# Patient Record
Sex: Male | Born: 1995 | Race: Black or African American | Hispanic: No | Marital: Single | Smoking: Never smoker
Health system: Southern US, Community
[De-identification: ages and names within clinical notes are randomized; demographics above are authoritative.]

## PROBLEM LIST (undated history)

## (undated) DIAGNOSIS — K029 Dental caries, unspecified: Secondary | ICD-10-CM

## (undated) DIAGNOSIS — N049 Nephrotic syndrome with unspecified morphologic changes: Secondary | ICD-10-CM

## (undated) HISTORY — DX: Nephrotic syndrome with unspecified morphologic changes: N04.9

## (undated) HISTORY — DX: Dental caries, unspecified: K02.9

## (undated) HISTORY — PX: RENAL BIOPSY: SHX156

---
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
## 2016-04-14 ENCOUNTER — Encounter: Payer: Self-pay | Admitting: *Deleted

## 2016-04-14 ENCOUNTER — Ambulatory Visit (INDEPENDENT_AMBULATORY_CARE_PROVIDER_SITE_OTHER): Payer: Medicaid Other | Admitting: Pediatrics

## 2016-04-14 VITALS — Temp 97.8°F | Wt 134.8 lb

## 2016-04-14 DIAGNOSIS — R1084 Generalized abdominal pain: Secondary | ICD-10-CM

## 2016-04-14 DIAGNOSIS — R197 Diarrhea, unspecified: Secondary | ICD-10-CM | POA: Diagnosis not present

## 2016-04-14 NOTE — Progress Notes (Addendum)
History was provided by the patient.  [name] is a 20 y.o. male who is here for acute visit.     HPI:   Abdominal pain - diffuse but more concentrated in epigastric and LUQ region for the past 5 days.  Has been sharp and seems to be constant with bouts of 10 minutes of relief. No chest pain sore throat, SOB, or vomiting.  Has had non bloody watery diarrhea for 5 days especially when he eats. Drinking plenty of fluids with no dizziness or headaches.  Denies fevers or rashes.  No oral or anal ulcers.  No nausea and no sick contacts- sister had appendectomy recently.  Works at [employer] doing contract work- went this am and sent home with multiple episodes of diarrhea. Does eat [employer] almost daily.  Has had diarrhea lasting for two days with Dione [employer] Bell in the past.  Not currently sexually active. No recent travel out of country, no water park exposures, no new exposures.   Review of Systems  Constitutional: Negative for chills, fever, malaise/fatigue and weight loss.  HENT: Negative for congestion and sore throat.   Eyes: Negative for blurred vision.  Respiratory: Negative for cough and shortness of breath.   Cardiovascular: Negative for chest pain, palpitations and leg swelling.  Gastrointestinal: Positive for abdominal pain and diarrhea. Negative for blood in stool, heartburn, melena, nausea and vomiting.  Genitourinary: Negative for dysuria, frequency and urgency.       No testicular pain  Musculoskeletal: Negative for myalgias.  Skin: Negative for rash.  Neurological: Negative for dizziness, weakness and headaches.  Endo/Heme/Allergies: Does not bruise/bleed easily.      The following portions of the patient's history were reviewed and updated as appropriate: allergies, current medications, past family history, past medical history, past social history, past surgical history and problem list.  Physical Exam:  Temp 97.8 F (36.6 C) (Temporal)   Wt 134 lb 12.8 oz (61.1  kg)   BMI 20.50 kg/m   No blood pressure reading on file for this encounter. No LMP for male patient.    General:   alert, cooperative and no distress     Skin:   normal and no rash  Oral cavity:   normal findings: lips normal without lesions, buccal mucosa normal, teeth intact, non-carious, palate normal, tongue midline and normal and oropharynx pink & moist without lesions or evidence of thrush  Eyes:   sclerae white, pupils equal and reactive  Ears:   normal bilaterally  Nose: clear, no discharge  Neck:  Neck appearance: Normal  Lungs:  clear to auscultation bilaterally  Heart:   regular rate and rhythm, S1, S2 normal, no murmur, click, rub or gallop   Abdomen:  normal findings: no masses palpable and no organomegaly and abnormal findings:  hyperactive bowel sounds and tenderness with palpation LUQ, no rebound or guarding.   GU:  not examined  Extremities:   extremities normal, atraumatic, no cyanosis or edema  Neuro:  normal without focal findings, mental status, speech normal, alert and oriented x3 and reflexes normal and symmetric    Assessment/Plan: [name] is a 20 yo M here for acute visit due to 5 day history of abdominal pain and diarrhea.  Appears well hydrated and non toxic on exam with some mild tenderness of LUQ. Given history with frequent D[employer] exposure and non bloody diarrhea without other systemic symptoms likely due to infectious cause.  No signs of edema or MCD. Discussed BRAT diet and  keeping well hydrated and drinking plenty of fluids  Sick and Emergent signs reviewed.  Will return in 2-3 days PRN persistent or worsening- planned to obtain bloodwork and +/- imaging to rule out appendicitis vs pancreatitis vs IBD.     Ancil LinseyKhalia L Anuhea Gassner, MD  04/14/16

## 2016-04-14 NOTE — Patient Instructions (Addendum)
Please continue to keep well hydrated drinking plenty of fluids. Try A BRAT diet to help form stools  Diarrhea Diarrhea is frequent loose and watery bowel movements. It can cause you to feel weak and dehydrated. Dehydration can cause you to become tired and thirsty, have a dry mouth, and have decreased urination that often is dark yellow. Diarrhea is a sign of another problem, most often an infection that will not last long. In most cases, diarrhea typically lasts 2-3 days. However, it can last longer if it is a sign of something more serious. It is important to treat your diarrhea as directed by your caregiver to lessen or prevent future episodes of diarrhea. CAUSES  Some common causes include:  Gastrointestinal infections caused by viruses, bacteria, or parasites.  Food poisoning or food allergies.  Certain medicines, such as antibiotics, chemotherapy, and laxatives.  Artificial sweeteners and fructose.  Digestive disorders. HOME CARE INSTRUCTIONS  Ensure adequate fluid intake (hydration): Have 1 cup (8 oz) of fluid for each diarrhea episode. Avoid fluids that contain simple sugars or sports drinks, fruit juices, whole milk products, and sodas. Your urine should be clear or pale yellow if you are drinking enough fluids. Hydrate with an oral rehydration solution that you can purchase at pharmacies, retail stores, and online. You can prepare an oral rehydration solution at home by mixing the following ingredients together:   - tsp table salt.   tsp baking soda.   tsp salt substitute containing potassium chloride.  1  tablespoons sugar.  1 L (34 oz) of water.  Certain foods and beverages may increase the speed at which food moves through the gastrointestinal (GI) tract. These foods and beverages should be avoided and include:  Caffeinated and alcoholic beverages.  High-fiber foods, such as raw fruits and vegetables, nuts, seeds, and whole grain breads and cereals.  Foods and  beverages sweetened with sugar alcohols, such as xylitol, sorbitol, and mannitol.  Some foods may be well tolerated and may help thicken stool including:  Starchy foods, such as rice, toast, pasta, low-sugar cereal, oatmeal, grits, baked potatoes, crackers, and bagels.  Bananas.  Applesauce.  Add probiotic-rich foods to help increase healthy bacteria in the GI tract, such as yogurt and fermented milk products.  Wash your hands well after each diarrhea episode.  Only take over-the-counter or prescription medicines as directed by your caregiver.  Take a warm bath to relieve any burning or pain from frequent diarrhea episodes. SEEK IMMEDIATE MEDICAL CARE IF:   You are unable to keep fluids down.  You have persistent vomiting.  You have blood in your stool, or your stools are black and tarry.  You do not urinate in 6-8 hours, or there is only a small amount of very dark urine.  You have abdominal pain that increases or localizes.  You have weakness, dizziness, confusion, or light-headedness.  You have a severe headache.  Your diarrhea gets worse or does not get better.  You have a fever or persistent symptoms for more than 2-3 days.  You have a fever and your symptoms suddenly get worse. MAKE SURE YOU:   Understand these instructions.  Will watch your condition.  Will get help right away if you are not doing well or get worse.   This information is not intended to replace advice given to you by your health care provider. Make sure you discuss any questions you have with your health care provider.   Document Released: 08/01/2002 Document Revised: 09/01/2014 Document Reviewed: 04/18/2012  Elsevier Interactive Patient Education [employer] Inc.  Call for appointment as needed for worsening symptoms as needed.

## 2016-06-14 ENCOUNTER — Ambulatory Visit (INDEPENDENT_AMBULATORY_CARE_PROVIDER_SITE_OTHER): Payer: Medicaid Other | Admitting: Pediatrics

## 2016-06-14 ENCOUNTER — Encounter: Payer: Self-pay | Admitting: Pediatrics

## 2016-06-14 VITALS — Temp 97.8°F | Wt 131.6 lb

## 2016-06-14 DIAGNOSIS — A084 Viral intestinal infection, unspecified: Secondary | ICD-10-CM | POA: Diagnosis not present

## 2016-06-14 DIAGNOSIS — Z130 Encounter for screening for diseases of the blood and blood-forming organs and certain disorders involving the immune mechanism: Secondary | ICD-10-CM

## 2016-06-14 LAB — POCT HEMOGLOBIN: HEMOGLOBIN: 13.8 g/dL — AB (ref 14.1–18.1)

## 2016-06-14 NOTE — Patient Instructions (Addendum)
High-Calorie, High-Protein Diet Why Follow a High-Calorie, High-Protein Diet? A high-calorie, high-protein diet may be recommended if you have recently lost weight, have a poor appetite, or have an increased need for protein, such as with a burn or infection. Eating a high-calorie, high-protein diet can help you:  Have more energy  Gain weight or stop losing weight  Heal  Resist infection  Recover faster from surgery or illness High-Calorie, High-Protein Diet Food Guide Below are lists of foods that are high in calories and protein. Whenever possible, include foods from these lists in your snacks and meals:  High-Calorie Foods High-Protein Foods  Cheese, cream cheese  Whole milk, heavy cream, whipped cream  Sour cream  Butter, margarine, oil  Ice cream  Cake, cookies, chocolate  Gravy  Salad dressing, mayonnaise  Avocado  Jam, jelly, syrup  Honey, sugar  Dried Fruit Cheese, cottage cheese  Milk, soy milk, milk powder  Eggs  Yogurt  Nuts, seeds  Peanut butter  Tofu and other soy products  Beans, peas, lentils  Beef, poultry, pork, and other meats  Fish and other seafood  Snack Suggestions Snack  Directions  Calories   Fruit smoothie Blend 8 ounces whole milk vanilla yogurt +  cup orange juice + 1 cup frozen berries 360  Egg and cheese English muffin 1 whole wheat English muffin + 2 teaspoons margarine spread or butter + 1 ounce cheese + 1 egg 365  Peanut butter and banana sandwich 2 slices of bread + 2 tablespoons peanut butter + 1 sliced banana 400  Trail mix  cup nuts, seeds, and dried fruit 350  Cereal, milk, and banana 1 cup presweetened wheat cereal + 8 ounces whole milk + 1 banana 360  Yogurt and granola 1 cup whole milk flavored yogurt +  cup low-fat granola 440  Ten Tips for Increasing Calorie and Protein Intake Eat small, frequent meals and snacks throughout the day.  Keep prepared, ready-to-eat snacks on hand while at home, at the office, and on the road.  Drink  your calories. Choose high-calorie fluids, such as milk, blended coffee drinks, milk shakes, or juice.  Add protein powder or powdered milk to your beverages, smoothies, and foods, such as cream soups, scrambled eggs, gravy, and mashed potatoes.  Melt cheese onto sandwiches, bread, tortillas, eggs, meat, and vegetables.  Use milk in place of water when cooking and when preparing foods, such as hot cereal, cocoa, or pudding.  Load salads with hardboiled eggs, avocado, nuts, cheese, and dressing.  Use peanut butter or creamy salad dressings as a dip for raw veggies.  Try commercial supplements, such as Boost, Ensure, Resource, or Carnation Instant Breakfast.  Talk to a registered dietitian. They can help you develop an individualized eating plan.   

## 2016-06-14 NOTE — Progress Notes (Signed)
History was provided by the patient.  Interpreter needed: none   [name] is a 20 y.o. male presents  Chief Complaint  Patient presents with  . Diarrhea    x1 week, has had some vomiting   For the past week has had two episodes of watery stools everyday.  Non-bloody.  Emesis is clear, happens once a day.  Normal voids.  Has abdominal pain right before the diarrhea and relieved when he has a stool.  Still eating Taco bell daily, for the past week has eaten Taco bell daily before this incident of diarrhea and vomiting was 3.5 weeks.  No rashes, sores or ulcers anywhere.  No recent travel.     The following portions of the patient's history were reviewed and updated as appropriate: allergies, current medications, past family history, past medical history, past social history, past surgical history and problem list.  Review of Systems  Constitutional: Negative for fever and weight loss.  HENT: Negative for congestion, ear discharge, ear pain and sore throat.   Eyes: Negative for pain, discharge and redness.  Respiratory: Negative for cough and shortness of breath.   Cardiovascular: Negative for chest pain.  Gastrointestinal: Positive for abdominal pain, diarrhea and vomiting.  Genitourinary: Negative for dysuria, frequency and hematuria.  Musculoskeletal: Negative for back pain, falls and neck pain.  Skin: Negative for rash.  Neurological: Negative for speech change, loss of consciousness and weakness.  Endo/Heme/Allergies: Does not bruise/bleed easily.  Psychiatric/Behavioral: The patient does not have insomnia.      Physical Exam:  Temp 97.8 F (36.6 C) (Temporal)   Wt 131 lb 9.6 oz (59.7 kg)   BMI 20.01 kg/m  No blood pressure reading on file for this encounter. Wt Readings from Last 3 Encounters:  06/14/16 131 lb 9.6 oz (59.7 kg) (15 %, Z= -1.03)*  04/14/16 134 lb 12.8 oz (61.1 kg) (20 %, Z= -0.82)*  11/05/15 136 lb 1 oz (61.7 kg) (25 %, Z= -0.68)*   * Growth  percentiles are based on CDC 2-20 Years data.   HR: 90  General:   alert, cooperative, appears stated age and no distress  Oral cavity:   lips, mucosa, and tongue normal; no sores or ulcers in his mouth   Lungs:  clear to auscultation bilaterally  Heart:   regular rate and rhythm, S1, S2 normal, no murmur, click, rub or gallop   Abd NT,ND, soft, no organomegaly, normal bowel sounds   Neuro:  normal without focal findings     Assessment/Plan: 1. Screening for iron deficiency anemia - POCT hemoglobin(normal)   2. Viral gastroenteritis This could be due to his [employer]aco Bell intake because the last time he was eating [employer] consistently he had the same symptoms and he took a break and this past week is his 1st time eating it regularly again. However the symptoms are improving despite him still eating Taco bell.  I didn't do a PO challenge because he is able to drink without vomiting and he is well hydrated.  Patient has also lost weight since march 2017 but looking at his trajectory over the last 2 years it has been fluctuating between 129 to 145, he has been on and off steroids between this time. He says he is trying to gain weight.    He is way behind for a well visit so I had one scheduled for him with his PCP and gave him a handout on high caloric foods.  Francis Imbert Griffith Citron, MD  06/14/16

## 2016-07-27 ENCOUNTER — Other Ambulatory Visit: Payer: Self-pay | Admitting: Pediatrics

## 2016-07-28 ENCOUNTER — Ambulatory Visit: Payer: Self-pay | Admitting: Pediatrics

## 2017-07-05 ENCOUNTER — Emergency Department (HOSPITAL_COMMUNITY)
Admission: EM | Admit: 2017-07-05 | Discharge: 2017-07-05 | Disposition: A | Discharge status: Home / Self Care | Payer: Medicaid Other | Attending: Emergency Medicine | Admitting: Emergency Medicine

## 2017-07-05 ENCOUNTER — Encounter (HOSPITAL_COMMUNITY): Payer: Self-pay | Admitting: *Deleted

## 2017-07-05 DIAGNOSIS — Z7982 Long term (current) use of aspirin: Secondary | ICD-10-CM | POA: Diagnosis not present

## 2017-07-05 DIAGNOSIS — R1013 Epigastric pain: Secondary | ICD-10-CM | POA: Diagnosis not present

## 2017-07-05 DIAGNOSIS — E860 Dehydration: Secondary | ICD-10-CM | POA: Diagnosis not present

## 2017-07-05 DIAGNOSIS — R197 Diarrhea, unspecified: Secondary | ICD-10-CM | POA: Insufficient documentation

## 2017-07-05 LAB — CBC
HEMATOCRIT: 49.5 % (ref 39.0–52.0)
Hemoglobin: 17.9 g/dL — ABNORMAL HIGH (ref 13.0–17.0)
MCH: 30.4 pg (ref 26.0–34.0)
MCHC: 36.2 g/dL — ABNORMAL HIGH (ref 30.0–36.0)
MCV: 84 fL (ref 78.0–100.0)
Platelets: 321 10*3/uL (ref 150–400)
RBC: 5.89 MIL/uL — ABNORMAL HIGH (ref 4.22–5.81)
RDW: 12.3 % (ref 11.5–15.5)
WBC: 5.6 10*3/uL (ref 4.0–10.5)

## 2017-07-05 LAB — URINALYSIS, ROUTINE W REFLEX MICROSCOPIC
BACTERIA UA: NONE SEEN
Bilirubin Urine: NEGATIVE
Glucose, UA: NEGATIVE mg/dL
Ketones, ur: NEGATIVE mg/dL
Leukocytes, UA: NEGATIVE
Nitrite: NEGATIVE
PH: 6 (ref 5.0–8.0)
SQUAMOUS EPITHELIAL / LPF: NONE SEEN
Specific Gravity, Urine: 1.024 (ref 1.005–1.030)

## 2017-07-05 LAB — COMPREHENSIVE METABOLIC PANEL
ALT: 11 U/L — AB (ref 17–63)
AST: 22 U/L (ref 15–41)
Albumin: 1 g/dL — ABNORMAL LOW (ref 3.5–5.0)
Alkaline Phosphatase: 78 U/L (ref 38–126)
Anion gap: 6 (ref 5–15)
BUN: 19 mg/dL (ref 6–20)
CO2: 25 mmol/L (ref 22–32)
CREATININE: 0.91 mg/dL (ref 0.61–1.24)
Calcium: 7.8 mg/dL — ABNORMAL LOW (ref 8.9–10.3)
Chloride: 104 mmol/L (ref 101–111)
GFR calc non Af Amer: >60 mL/min (ref >60)
Glucose, Bld: 98 mg/dL (ref 65–99)
Potassium: 4.5 mmol/L (ref 3.5–5.1)
Sodium: 135 mmol/L (ref 135–145)
Total Bilirubin: 1.1 mg/dL (ref 0.3–1.2)
Total Protein: 3.8 g/dL — ABNORMAL LOW (ref 6.5–8.1)

## 2017-07-05 LAB — LIPASE, BLOOD: Lipase: 29 U/L (ref 11–51)

## 2017-07-05 MED ORDER — SODIUM CHLORIDE 0.9 % IV BOLUS (SEPSIS)
1000.0000 mL | Freq: Once | INTRAVENOUS | Status: AC
  Administered 2017-07-05: 1000 mL via INTRAVENOUS

## 2017-07-05 MED ORDER — ONDANSETRON HCL 4 MG/2ML IJ SOLN
4.0000 mg | Freq: Once | INTRAMUSCULAR | Status: AC
  Administered 2017-07-05: 4 mg via INTRAVENOUS
  Filled 2017-07-05: qty 2

## 2017-07-05 MED ORDER — ONDANSETRON HCL 4 MG PO TABS: 4.0000 mg | ORAL_TABLET | Freq: Four times a day (QID) | ORAL | 0 refills | Status: AC

## 2017-07-05 NOTE — ED Triage Notes (Signed)
Pt reports ETOH use last night. Now has severe generalized abd pain with diarrhea. Denies n/v.

## 2017-07-05 NOTE — ED Provider Notes (Signed)
[facility] Provider Note   CSN: [id number] Arrival date & time: 07/05/17  1005     History   Chief Complaint Chief Complaint  Patient presents with  . Abdominal Pain  . Diarrhea    HPI Devoria AlbeZion A Sobalvarro is a 21 y.o. male.  Patient is a 21 year old male with a history of nephrotic syndrome since the age of 89 which has resolved per the patient who is presenting with abdominal pain that started this morning, numerous episodes of diarrhea and some nausea.  He states he did have a fairly significant amount of alcohol to drink last night but he does that fairly regularly and has never had problems before.  He has had nausea without vomiting.  He denies any blood in his stool.  He is not aware of any bad food contact and nobody he was with yesterday seems to be ill that he is aware of.  No fever, shortness of breath or chest pain.  No urinary symptoms.  No recent travel outside the country or antibiotics in the last 2-3 months.   The history is provided by the patient.  Abdominal Pain   This is a new problem. The current episode started 3 to 5 hours ago. The problem occurs constantly. The problem has not changed since onset.The pain is associated with alcohol use. The pain is located in the epigastric region and periumbilical region. The quality of the pain is aching, cramping and colicky. The pain is at a severity of 7/10. The pain is moderate. Associated symptoms include anorexia, diarrhea and nausea. Pertinent negatives include fever, vomiting and dysuria. Nothing aggravates the symptoms. Nothing relieves the symptoms. Past medical history comments: History of nephrotic syndrome but no prior abdominal surgeries..  Diarrhea   Associated symptoms include abdominal pain. Pertinent negatives include no vomiting. Past medical history comments: History of nephrotic syndrome but no prior abdominal surgeries..    Past Medical History:  Diagnosis Date  . 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 Nephrotic syndrome    Diagnosed at age 89    Patient Active Problem List   Diagnosis Date Noted  . 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 [facility]       Home Medications    Prior to Admission medications   Medication Sig Start Date End Date Taking? Authorizing Provider  aspirin 81 MG chewable tablet Chew 81 mg by mouth. 11/07/14   [provider]  ibuprofen (ADVIL,MOTRIN) 800 MG tablet Take 1 tablet (800 mg total) by mouth 3 (three) times daily. Patient not taking: Reported on 06/14/2016 11/05/15   Anselm PancoastJoy, Shawn C, PA-C    Family History Family History  Problem Relation Age of Onset  . Diabetes Mother     Social History Social History   Tobacco Use  . Smoking status: Never Smoker  . Smokeless tobacco: Never Used  Substance Use Topics  . Alcohol use: No  . Drug use: No     Allergies   Patient has no known allergies.   Review of Systems Review of Systems  Constitutional: Negative for fever.  Gastrointestinal: Positive for abdominal pain, anorexia, diarrhea and nausea. Negative for vomiting.  Genitourinary: Negative for dysuria.  All other systems reviewed and are negative.    Physical Exam Updated Vital Signs BP (!) 141/88 (BP Location: Right Arm)   Pulse 85  Temp 98.3 F (36.8 C) (Oral)   Resp 18   SpO2 100%   Physical Exam  Constitutional: He is oriented to person, place, and time. He appears well-developed and well-nourished. No distress.  HENT:  Head: Normocephalic and atraumatic.  Mouth/Throat: Oropharynx is clear and moist.  Eyes: Conjunctivae and EOM are normal. Pupils are equal, round, and reactive to light.  Neck: Normal range of motion. Neck supple.  Cardiovascular: Normal rate, regular rhythm and intact distal pulses.  No murmur heard. Pulmonary/Chest: Effort normal and breath sounds  normal. No respiratory distress. He has no wheezes. He has no rales.  Abdominal: Soft. He exhibits no distension. There is tenderness in the periumbilical area. There is no rebound and no guarding. No hernia.  Musculoskeletal: Normal range of motion. He exhibits no edema or tenderness.  Neurological: He is alert and oriented to person, place, and time.  Skin: Skin is warm and dry. No rash noted. No erythema.  Psychiatric: He has a normal mood and affect. His behavior is normal.  Nursing note and vitals reviewed.    ED Treatments / Results  Labs (all labs ordered are listed, but only abnormal results are displayed) Labs Reviewed  COMPREHENSIVE METABOLIC PANEL - Abnormal; Notable for the following components:      Result Value   Calcium 7.8 (*)    Total Protein 3.8 (*)    Albumin 1.0 (*)    ALT 11 (*)    All other components within normal limits  CBC - Abnormal; Notable for the following components:   RBC 5.89 (*)    Hemoglobin 17.9 (*)    MCHC 36.2 (*)    All other components within normal limits  URINALYSIS, ROUTINE W REFLEX MICROSCOPIC - Abnormal; Notable for the following components:   APPearance HAZY (*)    Hgb urine dipstick SMALL (*)    Protein, ur >=300 (*)    All other components within normal limits  LIPASE, BLOOD    EKG  EKG Interpretation None       Radiology No results found.  Procedures Procedures (including critical care time)  Medications Ordered in ED Medications  ondansetron (ZOFRAN) injection 4 mg (not administered)  sodium chloride 0.9 % bolus 1,000 mL (not administered)     Initial Impression / Assessment and Plan / ED Course  I have reviewed the triage vital signs and the nursing notes.  Pertinent labs & imaging results that were available during my care of the patient were reviewed by me and considered in my medical decision making (see chart for details).     Patient presenting with symptoms most likely related to alcoholic gastritis  however he is also having diarrhea and nausea.  Patient symptoms could also be viral in nature or food borne illness.  Patient has no risk factors for C. difficile and no recent travel outside of the country.  Vital signs are within normal limits.  No peritoneal signs on exam.  CBC, CMP, lipase pending.  Patient given IV fluids and Zofran.  12:00 PM Labs significant for dehydration but no other acute findings.  Pt given 2L of fluids.  12:57 PM Pt feeling better and tolerating po's.  Will d/c home.  Final Clinical Impressions(s) / ED Diagnoses   Final diagnoses:  Diarrhea of presumed infectious origin  Dehydration  Epigastric pain    ED Discharge Orders        Ordered    ondansetron (ZOFRAN) 4 MG tablet  Every 6 hours  07/05/17 1338       Gwyneth Sprout, MD 07/05/17 1339

## 2017-07-05 NOTE — Discharge Instructions (Signed)
You can try taking Imodium for diarrhea if it continues.  Make sure you are drinking plenty of fluids and eating a bland diet

## 2017-07-07 ENCOUNTER — Other Ambulatory Visit: Payer: Self-pay

## 2017-07-07 ENCOUNTER — Encounter (HOSPITAL_COMMUNITY): Payer: Self-pay | Admitting: Nurse Practitioner

## 2017-07-07 ENCOUNTER — Emergency Department (HOSPITAL_COMMUNITY): Payer: Medicaid Other

## 2017-07-07 ENCOUNTER — Observation Stay (HOSPITAL_COMMUNITY)
Admission: EM | Admit: 2017-07-07 | Discharge: 2017-07-08 | Disposition: A | Discharge status: Home / Self Care | Payer: Medicaid Other | Attending: Family Medicine | Admitting: Family Medicine

## 2017-07-07 ENCOUNTER — Emergency Department (HOSPITAL_COMMUNITY)
Admission: EM | Admit: 2017-07-07 | Discharge: 2017-07-07 | Discharge status: Against Medical Advice | Payer: Medicaid Other | Source: Home / Self Care

## 2017-07-07 DIAGNOSIS — E875 Hyperkalemia: Secondary | ICD-10-CM | POA: Insufficient documentation

## 2017-07-07 DIAGNOSIS — R188 Other ascites: Secondary | ICD-10-CM | POA: Insufficient documentation

## 2017-07-07 DIAGNOSIS — K529 Noninfective gastroenteritis and colitis, unspecified: Secondary | ICD-10-CM | POA: Diagnosis not present

## 2017-07-07 DIAGNOSIS — K760 Fatty (change of) liver, not elsewhere classified: Secondary | ICD-10-CM | POA: Insufficient documentation

## 2017-07-07 DIAGNOSIS — Z7982 Long term (current) use of aspirin: Secondary | ICD-10-CM | POA: Insufficient documentation

## 2017-07-07 DIAGNOSIS — N049 Nephrotic syndrome with unspecified morphologic changes: Secondary | ICD-10-CM | POA: Diagnosis not present

## 2017-07-07 DIAGNOSIS — Z79899 Other long term (current) drug therapy: Secondary | ICD-10-CM | POA: Insufficient documentation

## 2017-07-07 DIAGNOSIS — R1013 Epigastric pain: Secondary | ICD-10-CM

## 2017-07-07 DIAGNOSIS — N040 Nephrotic syndrome with minor glomerular abnormality: Secondary | ICD-10-CM

## 2017-07-07 LAB — COMPREHENSIVE METABOLIC PANEL
ALK PHOS: 59 U/L (ref 38–126)
ALT: 14 U/L — AB (ref 17–63)
AST: 28 U/L (ref 15–41)
Albumin: <1 g/dL — ABNORMAL LOW (ref 3.5–5.0)
Anion gap: 5 (ref 5–15)
BILIRUBIN TOTAL: 0.4 mg/dL (ref 0.3–1.2)
BUN: 27 mg/dL — AB (ref 6–20)
CALCIUM: 6.8 mg/dL — AB (ref 8.9–10.3)
CO2: 26 mmol/L (ref 22–32)
Chloride: 102 mmol/L (ref 101–111)
Creatinine, Ser: 1.23 mg/dL (ref 0.61–1.24)
Glucose, Bld: 109 mg/dL — ABNORMAL HIGH (ref 65–99)
Potassium: 6.1 mmol/L — ABNORMAL HIGH (ref 3.5–5.1)
Sodium: 133 mmol/L — ABNORMAL LOW (ref 135–145)
Total Protein: 3.6 g/dL — ABNORMAL LOW (ref 6.5–8.1)

## 2017-07-07 LAB — CBC
HEMATOCRIT: 52.8 % — AB (ref 39.0–52.0)
HEMOGLOBIN: 19.3 g/dL — AB (ref 13.0–17.0)
MCH: 30.1 pg (ref 26.0–34.0)
MCHC: 36.6 g/dL — ABNORMAL HIGH (ref 30.0–36.0)
MCV: 82.4 fL (ref 78.0–100.0)
Platelets: 330 10*3/uL (ref 150–400)
RBC: 6.41 MIL/uL — AB (ref 4.22–5.81)
RDW: 12.3 % (ref 11.5–15.5)
WBC: 15.1 10*3/uL — ABNORMAL HIGH (ref 4.0–10.5)

## 2017-07-07 LAB — LIPASE, BLOOD: LIPASE: 21 U/L (ref 11–51)

## 2017-07-07 LAB — URINALYSIS, ROUTINE W REFLEX MICROSCOPIC
BILIRUBIN URINE: NEGATIVE
Bacteria, UA: NONE SEEN
Glucose, UA: NEGATIVE mg/dL
Ketones, ur: NEGATIVE mg/dL
Leukocytes, UA: NEGATIVE
Nitrite: NEGATIVE
PH: 6 (ref 5.0–8.0)
Protein, ur: >=300 mg/dL — AB
SPECIFIC GRAVITY, URINE: 1.04 — AB (ref 1.005–1.030)

## 2017-07-07 LAB — POTASSIUM: Potassium: 5.8 mmol/L — ABNORMAL HIGH (ref 3.5–5.1)

## 2017-07-07 MED ORDER — IOPAMIDOL (ISOVUE-300) INJECTION 61%
100.0000 mL | Freq: Once | INTRAVENOUS | Status: AC | PRN
  Administered 2017-07-07: 100 mL via INTRAVENOUS

## 2017-07-07 MED ORDER — SODIUM CHLORIDE 0.9 % IV BOLUS (SEPSIS)
1000.0000 mL | Freq: Once | INTRAVENOUS | Status: AC
  Administered 2017-07-07: 1000 mL via INTRAVENOUS

## 2017-07-07 MED ORDER — GI COCKTAIL ~~LOC~~
30.0000 mL | Freq: Once | ORAL | Status: AC
  Administered 2017-07-07: 30 mL via ORAL
  Filled 2017-07-07: qty 30

## 2017-07-07 MED ORDER — ONDANSETRON HCL 4 MG/2ML IJ SOLN
4.0000 mg | Freq: Once | INTRAMUSCULAR | Status: AC
  Administered 2017-07-07: 4 mg via INTRAVENOUS
  Filled 2017-07-07: qty 2

## 2017-07-07 MED ORDER — MORPHINE SULFATE (PF) 4 MG/ML IV SOLN
4.0000 mg | Freq: Once | INTRAVENOUS | Status: AC
  Administered 2017-07-07: 4 mg via INTRAVENOUS
  Filled 2017-07-07: qty 1

## 2017-07-07 MED ORDER — IOPAMIDOL (ISOVUE-300) INJECTION 61%
INTRAVENOUS | Status: AC
  Filled 2017-07-07: qty 100

## 2017-07-07 NOTE — ED Notes (Signed)
Critical Hgb 22.8 reported to EDPA

## 2017-07-07 NOTE — ED Triage Notes (Signed)
Patient was at [place] and got tired of waiting so he went home and called EMS to come get him so he would get in faster. Patient has been having abd pain since Sunday after eating pizza and drinking too much ETOH. The abd pain has not changed.

## 2017-07-07 NOTE — ED Notes (Signed)
Call report to Reema Chick [phone]

## 2017-07-07 NOTE — H&P (Signed)
History and Physical    [name] WUJ:[id number] DOB: 01-30-1996 DOA: 07/07/2017  PCP: Tilman Neat, MD  Patient coming from: home  Chief Complaint:  Abdominal pain, n/v/d  HPI: [name] is a 21 y.o. male with medical history significant of nephrotic syndrome as a child now resolved, comes in with 2 days of generalized abdominal pain associated with n/v/d.  Pt was seen in ED yesterday and treated for gastroenteritis.  Since then his diarrhea has resolved but his abdominal cramps have persisted.  He has had no vomiting in ED.  No blood in vomit.  No fevers.  He did binge drink on Sunday (2 days ago) but does not do this regularly.  Has not had any sick contacts.  Pt referred for admission for AKI (cr 1.2 from 0.9) and k level of over 6.  Review of Systems: As per HPI otherwise 10 point review of systems negative.   Past Medical History:  Diagnosis Date  . 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 Nephrotic syndrome    Diagnosed at age 64    Past Surgical History:  Procedure Laterality Date  . RENAL BIOPSY     At [facility]     reports that  has never smoked. he has never used smokeless tobacco. He reports that he does not drink alcohol or use drugs.  No Known Allergies  Family History  Problem Relation Age of Onset  . Diabetes Mother     Prior to Admission medications   Medication Sig Start Date End Date Taking? Authorizing Provider  ondansetron (ZOFRAN) 4 MG tablet Take 1 tablet (4 mg total) every 6 (six) hours by mouth. 07/05/17  Yes Plunkett, Alphonzo Lemmings, MD  aspirin 81 MG chewable tablet Chew 81 mg by mouth. 11/07/14   [provider]  ibuprofen (ADVIL,MOTRIN) 800 MG tablet Take 1 tablet (800 mg total) by mouth 3 (three) times daily. Patient not taking: Reported on 06/14/2016 11/05/15   Concepcion Living    Physical Exam: Vitals:   07/07/17 1439 07/07/17 1440 07/07/17 1938 07/07/17 2140  BP: (!) 147/98  140/81 (!) 140/93  Pulse: 90  85 62  Resp: 18  15 18   Temp:  98.6 F (37 C)     SpO2: 98%  100% 100%  Weight:  59.4 kg (131 lb)    Height:  5\' 9"  (1.753 m)      Constitutional: NAD, calm, comfortable Vitals:   07/07/17 1439 07/07/17 1440 07/07/17 1938 07/07/17 2140  BP: (!) 147/98  140/81 (!) 140/93  Pulse: 90  85 62  Resp: 18  15 18   Temp: 98.6 F (37 C)     SpO2: 98%  100% 100%  Weight:  59.4 kg (131 lb)    Height:  5\' 9"  (1.753 m)     Eyes: PERRL, lids and conjunctivae normal ENMT: Mucous membranes are moist. Posterior pharynx clear of any exudate or lesions.Normal dentition.  Neck: normal, supple, no masses, no thyromegaly Respiratory: clear to auscultation bilaterally, no wheezing, no crackles. Normal respiratory effort. No accessory muscle use.  Cardiovascular: Regular rate and rhythm, no murmurs / rubs / gallops. No extremity edema. 2+ pedal pulses. No carotid bruits.  Abdomen: no tenderness, no masses palpated. No hepatosplenomegaly. Bowel sounds positive.  Musculoskeletal: no clubbing / cyanosis. No joint deformity upper and lower extremities. Good ROM, no contractures. Normal muscle tone.  Skin: no rashes, lesions, ulcers. No induration Neurologic: CN 2-12 grossly intact. Sensation intact, DTR normal.  Strength 5/5 in all 4.  Psychiatric: Normal judgment and insight. Alert and oriented x 3. Normal mood.    Labs on Admission: I have personally reviewed following labs and imaging studies  CBC: Recent Labs  Lab 07/05/17 1113 07/07/17 1607 07/07/17 1802  WBC 5.6 QUESTIONABLE RESULTS, RECOMMEND RECOLLECT TO VERIFY 15.1*  HGB 17.9* QUESTIONABLE RESULTS, RECOMMEND RECOLLECT TO VERIFY 19.3*  HCT 49.5 QUESTIONABLE RESULTS, RECOMMEND RECOLLECT TO VERIFY 52.8*  MCV 84.0 QUESTIONABLE RESULTS, RECOMMEND RECOLLECT TO VERIFY 82.4  PLT 321 QUESTIONABLE RESULTS, RECOMMEND RECOLLECT TO VERIFY 330   Basic Metabolic Panel: Recent Labs  Lab 07/05/17 1113 07/07/17 1802 07/07/17 2135  NA 135 133*  --   K 4.5 6.1* 5.8*  CL 104 102  --     CO2 25 26  --   GLUCOSE 98 109*  --   BUN 19 27*  --   CREATININE 0.91 1.23  --   CALCIUM 7.8* 6.8*  --    GFR: Estimated Creatinine Clearance: 80.5 mL/min (by C-G formula based on SCr of 1.23 mg/dL). Liver Function Tests: Recent Labs  Lab 07/05/17 1113 07/07/17 1802  AST 22 28  ALT 11* 14*  ALKPHOS 78 59  BILITOT 1.1 0.4  PROT 3.8* 3.6*  ALBUMIN 1.0* <1.0*   Recent Labs  Lab 07/05/17 1113 07/07/17 1802  LIPASE 29 21   No results for input(s): AMMONIA in the last 168 hours. Coagulation Profile: No results for input(s): INR, PROTIME in the last 168 hours. Cardiac Enzymes: No results for input(s): CKTOTAL, CKMB, CKMBINDEX, TROPONINI in the last 168 hours. BNP (last 3 results) No results for input(s): PROBNP in the last 8760 hours. HbA1C: No results for input(s): HGBA1C in the last 72 hours. CBG: No results for input(s): GLUCAP in the last 168 hours. Lipid Profile: No results for input(s): CHOL, HDL, LDLCALC, TRIG, CHOLHDL, LDLDIRECT in the last 72 hours. Thyroid Function Tests: No results for input(s): TSH, T4TOTAL, FREET4, T3FREE, THYROIDAB in the last 72 hours. Anemia Panel: No results for input(s): VITAMINB12, FOLATE, FERRITIN, TIBC, IRON, RETICCTPCT in the last 72 hours. Urine analysis:    Component Value Date/Time   COLORURINE YELLOW 07/07/2017 2035   APPEARANCEUR HAZY (A) 07/07/2017 2035   LABSPEC 1.040 (H) 07/07/2017 2035   PHURINE 6.0 07/07/2017 2035   GLUCOSEU NEGATIVE 07/07/2017 2035   HGBUR SMALL (A) 07/07/2017 2035   BILIRUBINUR NEGATIVE 07/07/2017 2035   BILIRUBINUR neg 11/27/2014 1704   KETONESUR NEGATIVE 07/07/2017 2035   PROTEINUR >=300 (A) 07/07/2017 2035   UROBILINOGEN 1 11/27/2014 1722   NITRITE NEGATIVE 07/07/2017 2035   LEUKOCYTESUR NEGATIVE 07/07/2017 2035   Sepsis Labs: !!!!!!!!!!!!!!!!!!!!!!!!!!!!!!!!!!!!!!!!!!!! @LABRCNTIP (procalcitonin:4,lacticidven:4) )No results found for this or any previous visit (from the past 240 hour(s)).    Radiological Exams on Admission: Ct Abdomen Pelvis W Contrast  Result Date: 07/07/2017 CLINICAL DATA:  Abdomen pain since Sunday. EXAM: CT ABDOMEN AND PELVIS WITH CONTRAST TECHNIQUE: Multidetector CT imaging of the abdomen and pelvis was performed using the standard protocol following bolus administration of intravenous contrast. CONTRAST:  100mL ISOVUE-300 IOPAMIDOL (ISOVUE-300) INJECTION 61% COMPARISON:  None. FINDINGS: Lower chest: No acute abnormality. Hepatobiliary: No focal liver abnormality is seen. There is diffuse low density of liver. No gallstones, gallbladder wall thickening, or biliary dilatation. Pancreas: Unremarkable. No pancreatic ductal dilatation or surrounding inflammatory changes. Spleen: Normal in size without focal abnormality. Adrenals/Urinary Tract: Adrenal glands are unremarkable. Kidneys are normal, without renal calculi, focal lesion, or hydronephrosis. Bladder is unremarkable. Stomach/Bowel: Stomach is within normal  limits. Appendix appears normal. No evidence of bowel wall thickening, distention, or inflammatory changes. Vascular/Lymphatic: No significant vascular findings are present. No enlarged abdominal or pelvic lymph nodes. Reproductive: Prostate is unremarkable. Other: Ascites is identified in the abdomen pelvis. Musculoskeletal: No acute or significant osseous findings. IMPRESSION: Ascites in the abdomen and pelvis.  Fatty infiltration of liver. No other acute abnormality identified in the abdomen and pelvis appear Electronically Signed   By: Sherian ReinWei-Chen  Lin M.D.   On: 07/07/2017 19:27    EKG: Independently reviewed.  No changes   Assessment/Plan 21 yo male with viral gastroenterities and hyperkalemia  Principal Problem:   Hyperkalemia- improving with ivf.  Does not take any k pills.  Cont ivf overnight and repeat in am  Active Problems:   Gastroenteritis- supportive care   Pt does not have AKI   DVT prophylaxis:  scds  Code Status:  full Family  Communication: sister  Disposition Plan:  Per day team Consults called:  none Admission status:  observation   Ivi Griffith A MD [facility]  If 7PM-7AM, please contact night-coverage [email or web address] Password [email or web address]  07/07/2017, 11:11 PM

## 2017-07-07 NOTE — ED Provider Notes (Signed)
[facility]-EMERGENCY DEPT Provider Note   CSN: [id number] Arrival date & time: 07/07/17  1431     History   Chief Complaint No chief complaint on file.   HPI [name] is a 21 y.o. male.  HPI   [name] is a 21 year old male with a history of nephrotic syndrome since age 21 which he states has resolved who is presenting to the emergency department for evaluation of ongoing epigastric abdominal pain and nausea.  States that he had a significant amount of alcohol three nights ago and woke up the next morning with epigastric pain, nausea and vomiting.  He was seen in the emergency department two days ago, labs were unremarkable and he had subsequent improvement with fluids and antiemetics. Was sent home with presumed alcoholic gastritis versus viral gastroenteritis.  Since that time patient states that his epigastric abdominal pain has worsened.  Reports that it is now 8/10 in severity, constant. Pain is non-radiating.  Denies regular NSAID use, has tried Goody's powder for his symptoms without relief.  He also reports that he is unable to hold down any food or liquid, vomiting greater than 10 times per day.  Also has reduced appetite.  He denies fever, diarrhea, hematochezia, melena, hematemesis, dysuria, frequency, testicular pain, shortness of breath, chest pain.  He denies previous abdominal surgeries.  No recent antibiotic use in the past 3 months.  No recent travel outside of the country.  Past Medical History:  Diagnosis Date  . 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 Nephrotic syndrome    Diagnosed at age 89    Patient Active Problem List   Diagnosis Date Noted  . 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 [facility]     [facility]       Home Medications    Prior to Admission medications   Medication Sig Start Date End Date Taking? Authorizing Provider    aspirin 81 MG chewable tablet Chew 81 mg by mouth. 11/07/14   [provider]  ibuprofen (ADVIL,MOTRIN) 800 MG tablet Take 1 tablet (800 mg total) by mouth 3 (three) times daily. Patient not taking: Reported on 06/14/2016 11/05/15   Harolyn RutherfordJoy, Shawn C, PA-C  ondansetron (ZOFRAN) 4 MG tablet Take 1 tablet (4 mg total) every 6 (six) hours by mouth. 07/05/17   Gwyneth SproutPlunkett, Whitney, MD    Family History Family History  Problem Relation Age of Onset  . Diabetes Mother     Social History Social History   Tobacco Use  . Smoking status: Never Smoker  . Smokeless tobacco: Never Used  Substance Use Topics  . Alcohol use: No  . Drug use: No     Allergies   Patient has no known allergies.   Review of Systems Review of Systems  Constitutional: Positive for appetite change (low appetite). Negative for chills, fatigue and fever.  HENT: Negative for mouth sores.   Respiratory: Negative for shortness of breath.   Cardiovascular: Negative for chest pain.  Gastrointestinal: Positive for abdominal pain (epigastric), nausea and vomiting. Negative for blood in stool, constipation and diarrhea.  Genitourinary: Negative for difficulty urinating, dysuria, frequency, hematuria and testicular pain.  Musculoskeletal: Negative for arthralgias.  Skin: Negative for rash.  Neurological: Negative for headaches.  Psychiatric/Behavioral: Negative for agitation.     Physical Exam Updated Vital Signs BP (!) 147/98   Pulse 90   Temp 98.6 F (  37 C)   Resp 18   Ht 5\' 9"  (1.753 m)   Wt 59.4 kg (131 lb)   SpO2 98%   BMI 19.35 kg/m   Physical Exam  Constitutional: He is oriented to person, place, and time. He appears well-developed and well-nourished. No distress.  HENT:  Head: Normocephalic and atraumatic.  Mouth/Throat: Oropharynx is clear and moist. No oropharyngeal exudate.  Mucous membranes moist.   Eyes: Conjunctivae are normal. Pupils are equal, round, and reactive to light. Right eye  exhibits no discharge. Left eye exhibits no discharge.  Neck: Normal range of motion. Neck supple.  Cardiovascular: Normal rate, regular rhythm and intact distal pulses. Exam reveals no friction rub.  No murmur heard. Pulmonary/Chest: Effort normal and breath sounds normal. No respiratory distress. He has no wheezes. He has no rales.  Abdominal: Soft. Bowel sounds are normal. He exhibits no distension.  Acutely tender to palpation in the epigastric area, with guarding.  No rebound or rigidity.  No CVA tenderness.  McBurney's point negative, Murphy's sign negative.  Musculoskeletal: Normal range of motion.  Neurological: He is alert and oriented to person, place, and time. Coordination normal.  Skin: Skin is warm and dry. Capillary refill takes less than 2 seconds. He is not diaphoretic.  Psychiatric: He has a normal mood and affect. His behavior is normal.  Nursing note and vitals reviewed.    ED Treatments / Results  Labs (all labs ordered are listed, but only abnormal results are displayed) Labs Reviewed  CBC  COMPREHENSIVE METABOLIC PANEL  LIPASE, BLOOD  URINALYSIS, ROUTINE W REFLEX MICROSCOPIC    EKG  EKG Interpretation None       Radiology No results found.  Procedures Procedures (including critical care time)  Medications Ordered in ED Medications  sodium chloride 0.9 % bolus 1,000 mL (not administered)  ondansetron (ZOFRAN) injection 4 mg (not administered)  gi cocktail (Maalox,Lidocaine,Donnatal) (not administered)     Initial Impression / Assessment and Plan / ED Course  I have reviewed the triage vital signs and the nursing notes.  Pertinent labs & imaging results that were available during my care of the patient were reviewed by me and considered in my medical decision making (see chart for details).   Patient presents with ongoing epigastric abdominal pain and nausea for the past two days. Basic labs ordered. Symptoms treated with 2L IV fluid bolus,  anti-emetic, GI cocktail and Morphine. Discussed this patient with Dr Estell HarpinZammit who agrees with CT abdomen/pelvis given worsening of pain and acutely tender in the epigastric area with guarding on exam.  Labs reviewed. CBC reveals leukocytosis with WBC 15.1, Hgb 19.3. Suspect hemoglobin elevated due to hemoconcentration in the setting of dehydration from vomiting the last several days. Labs also reveal hyperkalemia with potassium 6.1, will reorder for verification. EKG ordered, no peaked T waves.  He also has elevated BUN (27 today versus 19 two days ago.)  Suspect this is also related to dehydration.  Patient's total protein and albumin are low (3.6 and <1 respectively.)  UA reveals proteinuria, no evidence of infection. Lipase is negative, do not suspect pancreatitis.   CT scan reveals ascites in abdomen and pelvis.  No evidence of bowel obstruction, appendicitis, pancreatitis, colitis.  Discussed results with Dr. Estell HarpinZammit who agrees with consulting hospitalist for admission given patient's ongoing abdominal pain, dehydration and AKI.  Spoke to hospitalist Dr. Onalee Huaavid who will admit patient.  Final Clinical Impressions(s) / ED Diagnoses   Final diagnoses:  Abdominal pain, epigastric  ED Discharge Orders    None       [name] 07/08/17 0150    Bethann Berkshire, MD 07/08/17 [phone]

## 2017-07-08 ENCOUNTER — Encounter (HOSPITAL_COMMUNITY): Payer: Self-pay

## 2017-07-08 ENCOUNTER — Other Ambulatory Visit: Payer: Self-pay

## 2017-07-08 DIAGNOSIS — R1013 Epigastric pain: Secondary | ICD-10-CM | POA: Diagnosis not present

## 2017-07-08 DIAGNOSIS — N040 Nephrotic syndrome with minor glomerular abnormality: Secondary | ICD-10-CM

## 2017-07-08 DIAGNOSIS — E875 Hyperkalemia: Secondary | ICD-10-CM | POA: Diagnosis not present

## 2017-07-08 DIAGNOSIS — K529 Noninfective gastroenteritis and colitis, unspecified: Secondary | ICD-10-CM

## 2017-07-08 LAB — PROTEIN / CREATININE RATIO, URINE
CREATININE, URINE: 172.21 mg/dL
Protein Creatinine Ratio: 3.36 mg/mg{Cre} — ABNORMAL HIGH (ref 0.00–0.15)
Total Protein, Urine: 578 mg/dL

## 2017-07-08 LAB — BASIC METABOLIC PANEL
ANION GAP: 4 — AB (ref 5–15)
BUN: 24 mg/dL — ABNORMAL HIGH (ref 6–20)
CALCIUM: 7 mg/dL — AB (ref 8.9–10.3)
CO2: 23 mmol/L (ref 22–32)
CREATININE: 1.06 mg/dL (ref 0.61–1.24)
Chloride: 103 mmol/L (ref 101–111)
GFR calc non Af Amer: >60 mL/min (ref >60)
Glucose, Bld: 116 mg/dL — ABNORMAL HIGH (ref 65–99)
Potassium: 4.7 mmol/L (ref 3.5–5.1)
SODIUM: 130 mmol/L — AB (ref 135–145)

## 2017-07-08 LAB — CBC
HCT: 52.4 % — ABNORMAL HIGH (ref 39.0–52.0)
Hemoglobin: 19.4 g/dL — ABNORMAL HIGH (ref 13.0–17.0)
MCH: 30.8 pg (ref 26.0–34.0)
MCHC: 37 g/dL — AB (ref 30.0–36.0)
MCV: 83.2 fL (ref 78.0–100.0)
PLATELETS: 294 10*3/uL (ref 150–400)
RBC: 6.3 MIL/uL — AB (ref 4.22–5.81)
RDW: 12.5 % (ref 11.5–15.5)
WBC: 14.3 10*3/uL — AB (ref 4.0–10.5)

## 2017-07-08 LAB — HIV ANTIBODY (ROUTINE TESTING W REFLEX): HIV Screen 4th Generation wRfx: NONREACTIVE

## 2017-07-08 MED ORDER — OMEPRAZOLE 40 MG PO CPDR
40.0000 mg | DELAYED_RELEASE_CAPSULE | Freq: Every day | ORAL | 0 refills | Status: AC
Start: 2017-07-08 — End: 2026-04-02

## 2017-07-08 MED ORDER — ONDANSETRON HCL 4 MG PO TABS: 4.0000 mg | ORAL_TABLET | Freq: Four times a day (QID) | ORAL | 0 refills | Status: AC | PRN

## 2017-07-08 MED ORDER — ONDANSETRON HCL 4 MG/2ML IJ SOLN: 4.0000 mg | Freq: Four times a day (QID) | INTRAMUSCULAR | Status: DC | PRN

## 2017-07-08 MED ORDER — ONDANSETRON HCL 4 MG PO TABS
4.0000 mg | ORAL_TABLET | Freq: Four times a day (QID) | ORAL | Status: DC | PRN
  Administered 2017-07-08: 4 mg via ORAL
  Filled 2017-07-08: qty 1

## 2017-07-08 MED ORDER — SODIUM CHLORIDE 0.9 % IV SOLN
INTRAVENOUS | Status: DC
  Administered 2017-07-08 (×2): via INTRAVENOUS

## 2017-07-08 MED ORDER — KETOROLAC TROMETHAMINE 30 MG/ML IJ SOLN
30.0000 mg | Freq: Four times a day (QID) | INTRAMUSCULAR | Status: DC | PRN
  Administered 2017-07-08: 30 mg via INTRAVENOUS
  Filled 2017-07-08: qty 1

## 2017-07-08 MED ORDER — PREDNISONE 20 MG PO TABS: 60.0000 mg | ORAL_TABLET | Freq: Every day | ORAL | 0 refills | Status: AC

## 2017-07-08 NOTE — Plan of Care (Signed)
  Education: Knowledge of General Education information will improve 07/08/2017 0437 - Progressing by Herbert PunAddison, Yudit Modesitt Y, RN   Health Behavior/Discharge Planning: Ability to manage health-related needs will improve 07/08/2017 0437 - Progressing by Herbert PunAddison, Crystall Donaldson Y, RN   Clinical Measurements: Ability to maintain clinical measurements within normal limits will improve 07/08/2017 0437 - Progressing by Herbert PunAddison, Chandi Nicklin Y, RN

## 2017-07-08 NOTE — Discharge Summary (Signed)
Physician Discharge Summary  [name] ZOX:[id number] DOB: 09/29/1995 DOA: 07/07/2017  PCP: Tilman Neat, MD  Admit date: 07/07/2017 Discharge date: 07/08/2017  Admitted From: Home Disposition: Home   Recommendations for Outpatient Follow-up:  Follow up with PCP in 1-2 weeks Follow up with nephrology as scheduled, discharged to start prednisone 60mg  daily for treatment of nephrotic syndrome relapse.  Home Health: None Equipment/Devices: None Discharge Condition: Stable CODE STATUS: Full Diet recommendation: As tolerated  Brief/Interim Summary: [name] is a 21 y.o. male with medical history significant for recurrent steroid-responsive nephrotic syndrome (since age 62, about 2x/yr last was 2016) who presented to the ED with 2 days of generalized abdominal pain associated with n/v/d. He had been seen in the ED and treated for gastroenteritis in the setting of binge drinking. Diarrhea had resolved, though he continued to have abdominal cramping so presented to the ED again. Labs demonstrated a SCr of 1.2 (from baseline of 0.9 - 1.1) and potassium of 6.1 without ECG changes. He was brought in for observation. He has not had any vomiting or diarrhea since arrival despite advancing diet to full liquids. He does not wish to advance diet to solids at this time. Creatinine has improved to baseline at 1.06.   Urinalysis was notable for proteinuria with albumin level <1, though no edema on exam. Nephrology was consulted for concern of recurrence of nephrotic syndrome. Relapse is suspected, and prednisone 60mg  daily x1 month was recommended along with PPI, to start after abdominal pain resolves. Nephrology follow up in [place] has been arranged as well.   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 The symptoms from this as slowly improving, and you are able to maintain your own hydration status. You can be discharged with the following recommendations:  - Continue taking fluids in small quantities very frequently. Advance your diet slowly as tolerated. You may take zofran as needed for nausea.  - Once your abdominal pain resolves, start taking prednisone 60mg  daily for a month to treat a recurrence of nephrotic syndrome. While you take this, you should also take omeprazole daily to prevent a stomach ulcer.  - Nephrology follow up has been scheduled for you on 12/6.  - If your symptoms worsen, seek medical attention   Increase activity slowly   Complete by:  As directed      Allergies as of 07/08/2017   No Known Allergies     Medication List    STOP taking these medications   ibuprofen 800 MG tablet Commonly known as:  ADVIL,MOTRIN     TAKE these medications   aspirin 81 MG chewable tablet Chew 81 mg by mouth.   omeprazole 40 MG capsule Commonly known as:  PRILOSEC Take 1 capsule (40 mg total) daily by mouth.   ondansetron 4 MG tablet Commonly known as:  ZOFRAN Take 1 tablet (4 mg total) every 6 (six) hours by mouth. What changed:  Another medication with the same name was added. Make sure you understand how and when to take each.   ondansetron 4 MG tablet Commonly known as:  ZOFRAN Take 1 tablet (4  mg total) every 6 (six) hours as needed by mouth for nausea. What changed:  You were already taking a medication with the same name, and this prescription was added. Make sure you understand how and when to take each.   predniSONE 20 MG tablet Commonly known as:  DELTASONE Take 3 tablets (60 mg total) daily by mouth.      Follow-up Information    Arita MissSanford, Klee Kolek B, MD Follow up on  07/30/2017.   Specialty:  Nephrology Why:  3:15pm Contact information: [address] [place] [id number] [phone]        Tilman NeatProse, Claudia C, MD Follow up.   Specialty:  Pediatrics Contact information: [address] [place] [address] [phone]          No Known Allergies  Consultations:  Nephrology, Dr. Arlean HoppingSchertz  Procedures/Studies: Ct Abdomen Pelvis W Contrast  Result Date: 07/07/2017 CLINICAL DATA:  Abdomen pain since Sunday. EXAM: CT ABDOMEN AND PELVIS WITH CONTRAST TECHNIQUE: Multidetector CT imaging of the abdomen and pelvis was performed using the standard protocol following bolus administration of intravenous contrast. CONTRAST:  100mL ISOVUE-300 IOPAMIDOL (ISOVUE-300) INJECTION 61% COMPARISON:  None. FINDINGS: Lower chest: No acute abnormality. Hepatobiliary: No focal liver abnormality is seen. There is diffuse low density of liver. No gallstones, gallbladder wall thickening, or biliary dilatation. Pancreas: Unremarkable. No pancreatic ductal dilatation or surrounding inflammatory changes. Spleen: Normal in size without focal abnormality. Adrenals/Urinary Tract: Adrenal glands are unremarkable. Kidneys are normal, without renal calculi, focal lesion, or hydronephrosis. Bladder is unremarkable. Stomach/Bowel: Stomach is within normal limits. Appendix appears normal. No evidence of bowel wall thickening, distention, or inflammatory changes. Vascular/Lymphatic: No significant vascular findings are present. No enlarged abdominal or pelvic lymph nodes. Reproductive: Prostate is unremarkable. Other: Ascites is identified in the abdomen pelvis. Musculoskeletal: No acute or significant osseous findings. IMPRESSION: Ascites in the abdomen and pelvis.  Fatty infiltration of liver. No other acute abnormality identified in the abdomen and pelvis appear Electronically Signed   By: Sherian ReinWei-Chen  Lin M.D.   On: 07/07/2017 19:27   Subjective: Has tolerated a full  liquid diet and declines solid food. Denies nausea or vomiting associated with solid intake, just says he can't eat anything when he doesn't have an appetite. No abdominal pain currently.   Discharge Exam: Vitals:   07/08/17 0520 07/08/17 1453  BP: 136/79 136/75  Pulse: 73 66  Resp: 16 16  Temp: 98.3 F (36.8 C) (!) 97.5 F (36.4 C)  SpO2: 100% 100%   General: Tired-appearing WD WN male in no distress Cardiovascular: RRR, S1/S2 +, no rubs, no gallops Respiratory: CTA bilaterally, no wheezing, no rhonchi Abdominal: Soft, NT, ND, bowel sounds + Extremities: No edema, no cyanosis  Labs: Basic Metabolic Panel: Recent Labs  Lab 07/05/17 1113 07/07/17 1802 07/07/17 2135 07/08/17 0032  NA 135 133*  --  130*  K 4.5 6.1* 5.8* 4.7  CL 104 102  --  103  CO2 25 26  --  23  GLUCOSE 98 109*  --  116*  BUN 19 27*  --  24*  CREATININE 0.91 1.23  --  1.06  CALCIUM 7.8* 6.8*  --  7.0*   Liver Function Tests: Recent Labs  Lab 07/05/17 1113 07/07/17 1802  AST 22 28  ALT 11* 14*  ALKPHOS 78 59  BILITOT 1.1 0.4  PROT 3.8* 3.6*  ALBUMIN 1.0* <1.0*   Recent Labs  Lab 07/05/17 1113 07/07/17 1802  LIPASE 29 21   CBC:  Recent Labs  Lab 07/08/17 0032  WBC 14.3*  HGB 19.4*  HCT 52.4*  MCV 83.2  PLT 294   Urinalysis    Component Value Date/Time   COLORURINE YELLOW 07/07/2017 2035   APPEARANCEUR HAZY (A) 07/07/2017 2035   LABSPEC 1.040 (H) 07/07/2017 2035   PHURINE 6.0 07/07/2017 2035   GLUCOSEU NEGATIVE 07/07/2017 2035   HGBUR SMALL (A) 07/07/2017 2035   BILIRUBINUR NEGATIVE 07/07/2017 2035   BILIRUBINUR neg 11/27/2014 1704   KETONESUR NEGATIVE 07/07/2017 2035   PROTEINUR >=300 (A) 07/07/2017 2035   UROBILINOGEN 1 11/27/2014 1722   NITRITE NEGATIVE 07/07/2017 2035   LEUKOCYTESUR NEGATIVE 07/07/2017 2035   Time coordinating discharge: Approximately 40 minutes  Hazeline Junkeryan Dot Splinter, MD  [facility] 07/08/2017, 4:42 PM Pager [phone]

## 2017-07-08 NOTE — Care Management Note (Signed)
Case Management Note  Patient Details  Name: [name] MRN: [id number] Date of Birth: 11/06/1995  Subjective/Objective: 21 y/o m admitted w/hyperkalemia. From home.                   Action/Plan:d/c plan home.   Expected Discharge Date:  (unknown)               Expected Discharge Plan:  Home/Self Care  In-House Referral:     Discharge planning Services  CM Consult  Post Acute Care Choice:    Choice offered to:     DME Arranged:    DME Agency:     HH Arranged:    HH Agency:     Status of Service:  In process, will continue to follow  If discussed at Long Length of Stay Meetings, dates discussed:    Additional Comments:  Lanier ClamMahabir, Emmalie Haigh, RN 07/08/2017, 10:03 AM

## 2017-07-08 NOTE — Consult Note (Addendum)
Renal Service Consult Note [facility]  Devoria AlbeZion A Dinsmore 07/08/2017 Maree KrabbeSCHERTZ,Kervens Roper D Requesting Physician:  Aubery Lapping. Grunz, MD  Reason for Consult:  Neph syndrome HPI: The patient is a 21 y.o. year-old with hx of childhood neph syndrome admitted with abd pain, n/v and diarrhea for 2 days since eating pizza and drinking too much alcohol.  He was admitted, and abd pain is better but not resolved.  The nausea is better and diarrhea resolved. He is too be dc'd home if he can eat solid food.  While here he was found to have severe hypoalbuminemia and U prot > 300.  Asked to see for nephrotic syndrome.    Pt states he had recurrent neph syndrome started age 89 and last episode was age 21.  He had many relapses, about 2 x / year up until about age 21 then the relapses slowed down.  States that he "always got better" with prednisone.  [facility] and then locally by a "Dr. Lubertha SouthProse" in [place] who had an office across from [facility].  He is open to establishing care with an adult nephrologist in town here.      ROS  denies CP  no joint pain   no HA  no blurry vision  no rash  no dysuria  no difficulty voiding  no change in urine color    Past Medical History  Past Medical History:  Diagnosis Date  . 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 Nephrotic syndrome    Diagnosed at age 89   Past Surgical History  Past Surgical History:  Procedure Laterality Date  . [facility]SY     [facility]   Family History  Family History  Problem Relation Age of Onset  . Diabetes Mother    Social History  reports that  has never smoked. he has never used smokeless tobacco. He reports that he drinks alcohol. He reports that he does not use drugs. Allergies No Known Allergies Home medications Prior to Admission medications   Medication Sig Start Date End Date Taking? Authorizing Provider  ondansetron (ZOFRAN) 4 MG tablet Take 1 tablet (4 mg total) every 6 (six) hours by mouth. 07/05/17  Yes Plunkett,  Alphonzo LemmingsWhitney, MD  aspirin 81 MG chewable tablet Chew 81 mg by mouth. 11/07/14   [provider]  ibuprofen (ADVIL,MOTRIN) 800 MG tablet Take 1 tablet (800 mg total) by mouth 3 (three) times daily. Patient not taking: Reported on 06/14/2016 11/05/15   Concepcion LivingJoy, Shawn C, PA-C   Liver Function Tests Recent Labs  Lab 07/05/17 1113 07/07/17 1802  AST 22 28  ALT 11* 14*  ALKPHOS 78 59  BILITOT 1.1 0.4  PROT 3.8* 3.6*  ALBUMIN 1.0* <1.0*   Recent Labs  Lab 07/05/17 1113 07/07/17 1802  LIPASE 29 21   CBC Recent Labs  Lab 07/07/17 1607 07/07/17 1802 07/08/17 0032  WBC QUESTIONABLE RESULTS, RECOMMEND RECOLLECT TO VERIFY 15.1* 14.3*  HGB QUESTIONABLE RESULTS, RECOMMEND RECOLLECT TO VERIFY 19.3* 19.4*  HCT QUESTIONABLE RESULTS, RECOMMEND RECOLLECT TO VERIFY 52.8* 52.4*  MCV QUESTIONABLE RESULTS, RECOMMEND RECOLLECT TO VERIFY 82.4 83.2  PLT QUESTIONABLE RESULTS, RECOMMEND RECOLLECT TO VERIFY 330 294   Basic Metabolic Panel Recent Labs  Lab 07/05/17 1113 07/07/17 1802 07/07/17 2135 07/08/17 0032  NA 135 133*  --  130*  K 4.5 6.1* 5.8* 4.7  CL 104 102  --  103  CO2 25 26  --  23  GLUCOSE 98 109*  --  116*  BUN 19 27*  --  24*  CREATININE 0.91 1.23  --  1.06  CALCIUM 7.8* 6.8*  --  7.0*   Iron/TIBC/Ferritin/ %Sat No results found for: IRON, TIBC, FERRITIN, IRONPCTSAT  Vitals:   07/07/17 2245 07/07/17 2300 07/08/17 0003 07/08/17 0520  BP: 138/81 (!) 143/95 (!) 144/95 136/79  Pulse: 70 68 62 73  Resp: 15 15 16 16   Temp:   98.1 F (36.7 C) 98.3 F (36.8 C)  TempSrc:   Oral Oral  SpO2: 100% 100% 100% 100%  Weight:   72 kg (158 lb 11.7 oz)   Height:   5\' 8"  (1.727 m)    Exam Gen thin AAM, no distress No rash, cyanosis or gangrene Sclera anicteric, throat clear  No jvd or bruits Chest clear bilat RRR no MRG Abd soft ntnd no mass or ascites +bs, mod diffuse abd tenderness GU normal male MS no joint effusions or deformity Ext no LE edema / no wounds or ulcers Neuro  is alert, Ox 3 , nf     Impression: 1. 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  He is having another relapse with low albumin and high U protein. Would start him on pred 60 mg /d x 1 month to start 2-3 days after his abd pain has resolved, and with PPI coverage for about 1 month.   He is open to establishing care with an adult nephrologist here in town, have made an appt for him w [facility] in early December, see the DC orders. Will sign off.  2. Abd pain/ n/v/d - improving, prob IBS vs gastroenteritis.     Plan - as above  Vinson Moselleob Daimien Patmon MD [facility] pager [phone]   07/08/2017, 11:46 AM

## 2017-07-11 ENCOUNTER — Emergency Department (HOSPITAL_COMMUNITY): Payer: Medicaid Other

## 2017-07-11 ENCOUNTER — Other Ambulatory Visit: Payer: Self-pay

## 2017-07-11 ENCOUNTER — Emergency Department (HOSPITAL_COMMUNITY)
Admission: EM | Admit: 2017-07-11 | Discharge: 2017-07-11 | Disposition: A | Discharge status: Home / Self Care | Payer: Medicaid Other | Attending: Emergency Medicine | Admitting: Emergency Medicine

## 2017-07-11 ENCOUNTER — Encounter (HOSPITAL_COMMUNITY): Payer: Self-pay

## 2017-07-11 DIAGNOSIS — R220 Localized swelling, mass and lump, head: Secondary | ICD-10-CM | POA: Diagnosis present

## 2017-07-11 DIAGNOSIS — N049 Nephrotic syndrome with unspecified morphologic changes: Secondary | ICD-10-CM | POA: Diagnosis not present

## 2017-07-11 DIAGNOSIS — Z7982 Long term (current) use of aspirin: Secondary | ICD-10-CM | POA: Diagnosis not present

## 2017-07-11 LAB — CBC WITH DIFFERENTIAL/PLATELET
Basophils Absolute: 0 10*3/uL (ref 0.0–0.1)
Basophils Relative: 0 %
Eosinophils Absolute: 0 10*3/uL (ref 0.0–0.7)
Eosinophils Relative: 0 %
HEMATOCRIT: 54.5 % — AB (ref 39.0–52.0)
Hemoglobin: 20.7 g/dL — ABNORMAL HIGH (ref 13.0–17.0)
LYMPHS ABS: 0.7 10*3/uL (ref 0.7–4.0)
LYMPHS PCT: 13 %
MCH: 31.2 pg (ref 26.0–34.0)
MCHC: 38 g/dL — AB (ref 30.0–36.0)
MCV: 82.1 fL (ref 78.0–100.0)
MONOS PCT: 8 %
Monocytes Absolute: 0.4 10*3/uL (ref 0.1–1.0)
NEUTROS ABS: 4.6 10*3/uL (ref 1.7–7.7)
Neutrophils Relative %: 79 %
Platelets: 326 10*3/uL (ref 150–400)
RBC: 6.64 MIL/uL — ABNORMAL HIGH (ref 4.22–5.81)
RDW: 12 % (ref 11.5–15.5)
WBC: 5.8 10*3/uL (ref 4.0–10.5)

## 2017-07-11 LAB — URINALYSIS, ROUTINE W REFLEX MICROSCOPIC
BACTERIA UA: NONE SEEN
BILIRUBIN URINE: NEGATIVE
Glucose, UA: NEGATIVE mg/dL
Hgb urine dipstick: NEGATIVE
Ketones, ur: NEGATIVE mg/dL
LEUKOCYTES UA: NEGATIVE
Nitrite: NEGATIVE
SPECIFIC GRAVITY, URINE: 1.013 (ref 1.005–1.030)
SQUAMOUS EPITHELIAL / LPF: NONE SEEN
pH: 7 (ref 5.0–8.0)

## 2017-07-11 LAB — BASIC METABOLIC PANEL
Anion gap: 6 (ref 5–15)
BUN: 17 mg/dL (ref 6–20)
CALCIUM: 7.5 mg/dL — AB (ref 8.9–10.3)
CO2: 28 mmol/L (ref 22–32)
CREATININE: 1.02 mg/dL (ref 0.61–1.24)
Chloride: 95 mmol/L — ABNORMAL LOW (ref 101–111)
GFR calc Af Amer: >60 mL/min (ref >60)
GFR calc non Af Amer: >60 mL/min (ref >60)
GLUCOSE: 105 mg/dL — AB (ref 65–99)
Potassium: 4.8 mmol/L (ref 3.5–5.1)
Sodium: 129 mmol/L — ABNORMAL LOW (ref 135–145)

## 2017-07-11 MED ORDER — HYDROCODONE-ACETAMINOPHEN 5-325 MG PO TABS: 1.0000 | ORAL_TABLET | Freq: Four times a day (QID) | ORAL | 0 refills | Status: DC | PRN

## 2017-07-11 NOTE — ED Provider Notes (Signed)
[facility]-EMERGENCY DEPT Provider Note   CSN: [id number] Arrival date & time: 07/11/17  0154     History   Chief Complaint Chief Complaint  Patient presents with  . Generalized Body Aches  . Edema    HPI [name] is a 21 y.o. male.  Patient with PMH remarkable for nephrotic syndrome, who was recently admitted to the hospital for hyperkalemia and dehydration in the setting of binge alcohol consumption presents to the ED with a chief complaint of generalized body aches and upper extremity edema.  He states that he was prescribed prednisone at discharge several days ago, but did not start taking it until today.  He reports some associated nausea and diarrhea.  He denies any vomiting.  He also states that he has some SOB.  He used to see a pediatric nephrologist, but hasn't switched over to an adult nephrologist yet.   The history is provided by the patient. No language interpreter was used.    Past Medical History:  Diagnosis Date  . 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 Nephrotic syndrome    Diagnosed at age 89    Patient Active Problem List   Diagnosis Date Noted  . Hyperkalemia 07/07/2017  . Gastroenteritis 07/07/2017  . 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 [facility]SY     [facility]       Home Medications    Prior to Admission medications   Medication Sig Start Date End Date Taking? Authorizing Provider  aspirin 81 MG chewable tablet Chew 81 mg by mouth. 11/07/14   [provider]  omeprazole (PRILOSEC) 40 MG capsule Take 1 capsule (40 mg total) daily by mouth. 07/08/17 08/07/17  Tyrone NineGrunz, Ryan B, MD  ondansetron (ZOFRAN) 4 MG tablet Take 1 tablet (4 mg total) every 6 (six) hours by mouth. 07/05/17   Gwyneth SproutPlunkett, Whitney, MD  ondansetron (ZOFRAN) 4 MG tablet Take 1 tablet (4 mg total) every 6 (six) hours as needed by mouth for nausea.  07/08/17   Tyrone NineGrunz, Ryan B, MD  predniSONE (DELTASONE) 20 MG tablet Take 3 tablets (60 mg total) daily by mouth. 07/08/17   Tyrone NineGrunz, Ryan B, MD    Family History Family History  Problem Relation Age of Onset  . Diabetes Mother     Social History Social History   Tobacco Use  . Smoking status: Never Smoker  . Smokeless tobacco: Never Used  Substance Use Topics  . Alcohol use: Yes    Comment: one drink a week   . Drug use: No     Allergies   Patient has no known allergies.   Review of Systems Review of Systems  All other systems reviewed and are negative.    Physical Exam Updated Vital Signs BP (!) 145/111   Pulse (!) 59   Temp 98.4 F (36.9 C) (Oral)   Resp 16   SpO2 99%   Physical Exam  Constitutional: He is oriented to person, place, and time. He appears well-developed and well-nourished.  HENT:  Head: Normocephalic and atraumatic.  Periorbital edema  Eyes: Conjunctivae and EOM are normal. Pupils are equal, round, and reactive to light. Right eye exhibits no discharge. Left eye exhibits no discharge. No scleral icterus.  Neck: Normal range of motion. Neck supple. No JVD present.  Cardiovascular: Normal rate, regular rhythm and normal heart sounds. Exam reveals no gallop and no friction rub.  No murmur heard. Pulmonary/Chest: Effort normal and breath sounds normal. No respiratory distress. He has no wheezes. He has no rales. He exhibits no tenderness.  CTAB  Abdominal: Soft. He exhibits no distension and no mass. There is no tenderness. There is no rebound and no guarding.  Musculoskeletal: Normal range of motion. He exhibits no edema or tenderness.  Neurological: He is alert and oriented to person, place, and time.  Skin: Skin is warm and dry.  Psychiatric: He has a normal mood and affect. His behavior is normal. Judgment and thought content normal.  Nursing note and vitals reviewed.    ED Treatments / Results  Labs (all labs ordered are listed, but only  abnormal results are displayed) Labs Reviewed  CBC WITH DIFFERENTIAL/PLATELET - Abnormal; Notable for the following components:      Result Value   RBC 6.64 (*)    Hemoglobin 20.7 (*)    HCT 54.5 (*)    MCHC 38.0 (*)    All other components within normal limits  BASIC METABOLIC PANEL - Abnormal; Notable for the following components:   Sodium 129 (*)    Chloride 95 (*)    Glucose, Bld 105 (*)    Calcium 7.5 (*)    All other components within normal limits  URINALYSIS, ROUTINE W REFLEX MICROSCOPIC - Abnormal; Notable for the following components:   Protein, ur >=300 (*)    All other components within normal limits    EKG  EKG Interpretation None       Radiology Dg Chest 2 View  Result Date: 07/11/2017 CLINICAL DATA:  Initial evaluation for acute shortness of breath. EXAM: CHEST  2 VIEW COMPARISON:  None available. FINDINGS: The cardiac and mediastinal silhouettes are within normal limits. The lungs are normally inflated. No airspace consolidation, pleural effusion, or pulmonary edema is identified. There is no pneumothorax. No acute osseous abnormality identified. IMPRESSION: No active cardiopulmonary disease. Electronically Signed   By: Rise MuBenjamin  McClintock M.D.   On: 07/11/2017 05:33    Procedures Procedures (including critical care time)  Medications Ordered in ED Medications - No data to display   Initial Impression / Assessment and Plan / ED Course  I have reviewed the triage vital signs and the nursing notes.  Pertinent labs & imaging results that were available during my care of the patient were reviewed by me and considered in my medical decision making (see chart for details).     Patient with nephrotic syndrome.  Upper body and facial edema.  Noted to be hypertensive.  Will check labs and reassess.  >300 protein in urine.  H/H appears heme concentrated.  Electrolytes seem consistent with dehydration, however the patient has significant edema.  He will likely  need to be diuresed, but gently due to his dehydration.  Discussed with Dr. Adela LankFloyd, who recommends consultation with medicine regarding management.  7:21 AM I have discussed the case with both Dr. Julian ReilGardner and Dr. Georgiana SpinnerGokul from [facility], who are both in agreement that the patient needs to continue his prednisone, but doesn't need a hospital admission at this time.    Both Dr. Julian ReilGardner and Dr. Georgiana SpinnerGokul have reviewed his prior workup and current lab results.  I discussed the plan with the patient and with Dr. Adela LankFloyd.  All parties are in agreement.  Final Clinical Impressions(s) / ED Diagnoses   Final diagnoses:  Nephrotic syndrome    ED Discharge Orders    None       Roxy HorsemanBrowning, Eshal Propps, PA-C 07/11/17 [id number]  Melene Plan, DO 07/11/17 2300

## 2017-07-11 NOTE — Discharge Instructions (Signed)
You need to follow-up with your primary care doctor next week to have blood work rechecked.   You MUST take the prednisone.  This is the treatment for the underlying disease process and is crucial to your improvement.  Please follow-up with the nephrologist as directed.

## 2017-07-11 NOTE — ED Notes (Signed)
Patient transported to X-ray 

## 2017-07-11 NOTE — ED Triage Notes (Addendum)
Pt BIB [facility] c/o SOB and lower back pain. Pt was seen here recently for a stomach virus. He also states that he has been experiencing generalized edema and pain since Sunday. He initially reported SOB with EMS, but was able to calm down and catch his breath. 150/100. Ambulatory on scene. Hx kidney disease. His breathing is unlabored and regular in triage.

## 2017-10-09 ENCOUNTER — Encounter (HOSPITAL_COMMUNITY): Payer: Self-pay | Admitting: Emergency Medicine

## 2017-10-09 ENCOUNTER — Other Ambulatory Visit: Payer: Self-pay

## 2017-10-09 DIAGNOSIS — L0231 Cutaneous abscess of buttock: Secondary | ICD-10-CM | POA: Diagnosis present

## 2017-10-09 DIAGNOSIS — Z79899 Other long term (current) drug therapy: Secondary | ICD-10-CM | POA: Insufficient documentation

## 2017-10-09 NOTE — ED Triage Notes (Signed)
Pt c/o abscess to left buttock x 4 days.

## 2017-10-10 ENCOUNTER — Emergency Department (HOSPITAL_COMMUNITY)
Admission: EM | Admit: 2017-10-10 | Discharge: 2017-10-10 | Disposition: A | Discharge status: Home / Self Care | Payer: Medicaid Other | Attending: Emergency Medicine | Admitting: Emergency Medicine

## 2017-10-10 DIAGNOSIS — L0231 Cutaneous abscess of buttock: Secondary | ICD-10-CM

## 2017-10-10 MED ORDER — OXYCODONE-ACETAMINOPHEN 5-325 MG PO TABS
1.0000 | ORAL_TABLET | Freq: Once | ORAL | Status: AC
  Administered 2017-10-10: 1 via ORAL
  Filled 2017-10-10: qty 1

## 2017-10-10 MED ORDER — OXYCODONE-ACETAMINOPHEN 5-325 MG PO TABS: 1.0000 | ORAL_TABLET | Freq: Four times a day (QID) | ORAL | 0 refills | Status: AC | PRN

## 2017-10-10 MED ORDER — LIDOCAINE-EPINEPHRINE (PF) 2 %-1:200000 IJ SOLN
10.0000 mL | Freq: Once | INTRAMUSCULAR | Status: AC
  Administered 2017-10-10: 10 mL
  Filled 2017-10-10: qty 20

## 2017-10-10 NOTE — ED Provider Notes (Signed)
[facility] EMERGENCY DEPARTMENT Provider Note   CSN: [id number] Arrival date & time: 10/09/17  2312     History   Chief Complaint Chief Complaint  Patient presents with  . Abscess    HPI [name] is a 22 y.o. male here for evaluation of boil to the left inner buttock for 4 days. He has pain with palpation directly to the area and sitting down. No fevers, chills, pain with bowel movements, blood or pus in stools, abdominal pain, dysuria, testicular pain or swelling. No previous history of abscesses. Denies IV drug use. No interventions PTA. No alleviating factors.  HPI  Past Medical History:  Diagnosis Date  . 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 Nephrotic syndrome    Diagnosed at age 50    Patient Active Problem List   Diagnosis Date Noted  . Hyperkalemia 07/07/2017  . Gastroenteritis 07/07/2017  . 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 RENAL BIOPSY     At [facility] Medications    Prior to Admission medications   Medication Sig Start Date End Date Taking? Authorizing Provider  HYDROcodone-acetaminophen (NORCO/VICODIN) 5-325 MG tablet Take 1-2 tablets every 6 (six) hours as needed by mouth. 07/11/17   Roxy Horseman, PA-C  omeprazole (PRILOSEC) 40 MG capsule Take 1 capsule (40 mg total) daily by mouth. 07/08/17 08/07/17  Tyrone Nine, MD  ondansetron (ZOFRAN) 4 MG tablet Take 1 tablet (4 mg total) every 6 (six) hours by mouth. Patient not taking: Reported on 07/11/2017 07/05/17   Gwyneth Sprout, MD  ondansetron (ZOFRAN) 4 MG tablet Take 1 tablet (4 mg total) every 6 (six) hours as needed by mouth for nausea. 07/08/17   Tyrone Nine, MD  oxyCODONE-acetaminophen (PERCOCET/ROXICET) 5-325 MG tablet Take 1 tablet by mouth every 6 (six) hours as needed for up to 2 days for severe pain. 10/10/17 10/12/17  Liberty Handy, PA-C  predniSONE  (DELTASONE) 20 MG tablet Take 3 tablets (60 mg total) daily by mouth. 07/08/17   Tyrone Nine, MD    Family History Family History  Problem Relation Age of Onset  . Diabetes Mother     Social History Social History   Tobacco Use  . Smoking status: Never Smoker  . Smokeless tobacco: Never Used  Substance Use Topics  . Alcohol use: Yes    Comment: one drink a week   . Drug use: No     Allergies   Patient has no known allergies.   Review of Systems Review of Systems  Skin:       +boil  All other systems reviewed and are negative.    Physical Exam Updated Vital Signs BP 130/82 (BP Location: Right Arm)   Pulse 80   Temp 98.2 F (36.8 C) (Oral)   Resp 15   Ht 5\' 8"  (1.727 m)   Wt 65.8 kg (145 lb)   SpO2 96%   BMI 22.05 kg/m   Physical Exam  Constitutional: He is oriented to person, place, and time. He appears well-developed and well-nourished. No distress.  NAD.  HENT:  Head: Normocephalic and atraumatic.  Right Ear: External ear normal.  Left Ear: External ear normal.  Nose: Nose normal.  Eyes: Conjunctivae and EOM are normal. No scleral icterus.  Neck: Normal range of motion. Neck supple.  Cardiovascular: Normal rate, regular rhythm, normal heart sounds  and intact distal pulses.  No murmur heard. Pulmonary/Chest: Effort normal and breath sounds normal. He has no wheezes.  Genitourinary:     Genitourinary Comments: Quarter-sized area of fluctuance, erythema, tenderness to left inner buttock approximately 1 inch away from anal sphincter. No expansion of erythema to anus or scrotum. Chaperone was present during DRE.  I was able to palpate the first 2-3cm of the rectum digitally without gross abnormalities or masses. Patient able to tolerate examination. No pain in perianal/rectal area with palpation.  There are no external fissures or external hemorrhoids noted.  No induration or swelling of the perianal skin. Stool color is brown with no gross blood or  purulence noted. DRE reveals good sphincter tone.  No tenderness to scrotum.   Musculoskeletal: Normal range of motion. He exhibits no deformity.  Neurological: He is alert and oriented to person, place, and time.  Skin: Skin is warm and dry. Capillary refill takes less than 2 seconds.  Psychiatric: He has a normal mood and affect. His behavior is normal. Judgment and thought content normal.  Nursing note and vitals reviewed.    ED Treatments / Results  Labs (all labs ordered are listed, but only abnormal results are displayed) Labs Reviewed - No data to display  EKG  EKG Interpretation None       Radiology No results found.  Procedures .Marland KitchenIncision and Drainage Date/Time: 10/10/2017 6:50 AM Performed by: Liberty Handy, PA-C Authorized by: Liberty Handy, PA-C   Consent:    Consent obtained:  Verbal   Consent given by:  Patient   Risks discussed:  Bleeding, incomplete drainage and infection   Alternatives discussed:  Referral Location:    Type:  Abscess   Size:  Quarter size   Location: left buttock. Pre-procedure details:    Skin preparation:  Betadine Anesthesia (see MAR for exact dosages):    Anesthesia method:  Local infiltration   Local anesthetic:  Lidocaine 2% WITH epi Procedure type:    Complexity:  Simple Procedure details:    Needle aspiration: no     Incision types:  Single straight   Scalpel blade:  11   Wound management:  Probed and deloculated and irrigated with saline   Drainage:  Purulent   Drainage amount:  Moderate   Packing materials:  1/4 in iodoform gauze Post-procedure details:    Patient tolerance of procedure:  Tolerated well, no immediate complications Comments:     Packing strip secured with tape to skin, gauze and paper tape on top   (including critical care time)  Medications Ordered in ED Medications  oxyCODONE-acetaminophen (PERCOCET/ROXICET) 5-325 MG per tablet 1 tablet (1 tablet Oral Given 10/10/17 0315)    lidocaine-EPINEPHrine (XYLOCAINE W/EPI) 2 %-1:200000 (PF) injection 10 mL (10 mLs Infiltration Given 10/10/17 0317)     Initial Impression / Assessment and Plan / ED Course  I have reviewed the triage vital signs and the nursing notes.  Pertinent labs & imaging results that were available during my care of the patient were reviewed by me and considered in my medical decision making (see chart for details).    22 y.o. yo male with abscess to left inner buttock.  No significant surrounding cellulitis. No associated fever. Given location, rectal exam was done and was reassuring. Abscess is approximately 1 inch from anal sphincter and does not appear to involve anal/rectal or scrotal areas. I&D performed in ED with adequate evacuation of purulent discharge. Given location of abscess, risk, packing was left in place.  No indication for antibiotics today. Will discharge with warm compresses, NSAIDs and I&D care instructions. Recommended return for wound check and packing removal in 48 hours. Patient aware of symptoms that would warrant return to ED sooner. Patient verbalized understanding and is agreeable with plan.    Final Clinical Impressions(s) / ED Diagnoses   Final diagnoses:  Abscess of buttock, left    ED Discharge Orders        Ordered    oxyCODONE-acetaminophen (PERCOCET/ROXICET) 5-325 MG tablet  Every 6 hours PRN     10/10/17 0445       Liberty HandyGibbons, Winnona Wargo J, PA-C 10/10/17 [id number]    Dione BoozeGlick, David, MD 10/10/17 [phone]

## 2017-10-10 NOTE — Discharge Instructions (Signed)
Warm compresses, heating pad at least twice daily.   For pain take 650 mg tylenol plus 600 mg ibuprofen plus 1 percocet every 6 hours.   You have packing still in place. Return to the emergency department in 2 days for wound re-check and packing removal. Return sooner if you notice increased swelling, fevers, chills, pain with passing a bowel movement, blood or pus in bowel movements.

## 2018-04-02 ENCOUNTER — Encounter (HOSPITAL_COMMUNITY): Payer: Self-pay | Admitting: Emergency Medicine

## 2018-04-02 ENCOUNTER — Emergency Department (HOSPITAL_COMMUNITY)
Admission: EM | Admit: 2018-04-02 | Discharge: 2018-04-02 | Disposition: A | Discharge status: Home / Self Care | Payer: Medicaid Other | Attending: Emergency Medicine | Admitting: Emergency Medicine

## 2018-04-02 ENCOUNTER — Other Ambulatory Visit: Payer: Self-pay

## 2018-04-02 DIAGNOSIS — R519 Headache, unspecified: Secondary | ICD-10-CM

## 2018-04-02 DIAGNOSIS — R51 Headache: Secondary | ICD-10-CM | POA: Insufficient documentation

## 2018-04-02 LAB — CBC WITH DIFFERENTIAL/PLATELET
ABS IMMATURE GRANULOCYTES: 0 10*3/uL (ref 0.0–0.1)
BASOS ABS: 0.1 10*3/uL (ref 0.0–0.1)
BASOS PCT: 2 %
Eosinophils Absolute: 0.1 10*3/uL (ref 0.0–0.7)
Eosinophils Relative: 2 %
HCT: 43.6 % (ref 39.0–52.0)
HEMOGLOBIN: 14.4 g/dL (ref 13.0–17.0)
Immature Granulocytes: 0 %
LYMPHS PCT: 34 %
Lymphs Abs: 2 10*3/uL (ref 0.7–4.0)
MCH: 28.5 pg (ref 26.0–34.0)
MCHC: 33 g/dL (ref 30.0–36.0)
MCV: 86.3 fL (ref 78.0–100.0)
MONO ABS: 0.5 10*3/uL (ref 0.1–1.0)
Monocytes Relative: 8 %
NEUTROS PCT: 54 %
Neutro Abs: 3.2 10*3/uL (ref 1.7–7.7)
PLATELETS: 258 10*3/uL (ref 150–400)
RBC: 5.05 MIL/uL (ref 4.22–5.81)
RDW: 11.9 % (ref 11.5–15.5)
WBC: 5.9 10*3/uL (ref 4.0–10.5)

## 2018-04-02 LAB — BASIC METABOLIC PANEL
Anion gap: 8 (ref 5–15)
BUN: 13 mg/dL (ref 6–20)
CHLORIDE: 102 mmol/L (ref 98–111)
CO2: 30 mmol/L (ref 22–32)
Calcium: 9.6 mg/dL (ref 8.9–10.3)
Creatinine, Ser: 0.9 mg/dL (ref 0.61–1.24)
Glucose, Bld: 89 mg/dL (ref 70–99)
POTASSIUM: 4.1 mmol/L (ref 3.5–5.1)
SODIUM: 140 mmol/L (ref 135–145)

## 2018-04-02 MED ORDER — KETOROLAC TROMETHAMINE 30 MG/ML IJ SOLN
30.0000 mg | Freq: Once | INTRAMUSCULAR | Status: AC
  Administered 2018-04-02: 30 mg via INTRAMUSCULAR
  Filled 2018-04-02: qty 1

## 2018-04-02 NOTE — ED Notes (Signed)
Signature pad not available. Discharge instructions reviewed and pt verbalized understanding.

## 2018-04-02 NOTE — ED Provider Notes (Signed)
[facility] Provider [facility]   CSN: [id number] Arrival date & time: 04/02/18  0031     History   Chief Complaint Chief Complaint  Patient presents with  . Headache    HPI [name] is a 22 y.o. male.  Patient presents to the emergency department with a chief complaint of headache.  He states that he has had intermittent headaches for the past several weeks.  He has not taken anything for symptoms.  He states that he had headaches like this is a child, but they resolved after he got glasses.  He states that he no longer wears glasses.  He reports some photophobia.  He denies any fever, chills, or neck stiffness.  Denies any slurred speech, numbness, weakness, or tingling.  He denies any other associated symptoms.  The history is provided by the patient. No language interpreter was used.    Past Medical History:  Diagnosis Date  . 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 Nephrotic syndrome    Diagnosed at age 89    Patient Active Problem List   Diagnosis Date Noted  . Hyperkalemia 07/07/2017  . Gastroenteritis 07/07/2017  . 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 RENAL BIOPSY     [facility]        Home Medications    Prior to Admission medications   Medication Sig Start Date End Date Taking? Authorizing Provider  HYDROcodone-acetaminophen (NORCO/VICODIN) 5-325 MG tablet Take 1-2 tablets every 6 (six) hours as needed by mouth. 07/11/17   Roxy HorsemanBrowning, Vonzell Lindblad, PA-C  omeprazole (PRILOSEC) 40 MG capsule Take 1 capsule (40 mg total) daily by mouth. 07/08/17 08/07/17  Tyrone NineGrunz, Ryan B, MD  ondansetron (ZOFRAN) 4 MG tablet Take 1 tablet (4 mg total) every 6 (six) hours by mouth. Patient not taking: Reported on 07/11/2017 07/05/17   Gwyneth SproutPlunkett, Whitney, MD  ondansetron (ZOFRAN) 4 MG tablet Take 1 tablet (4 mg total) every 6 (six) hours as needed by mouth for  nausea. 07/08/17   Tyrone NineGrunz, Ryan B, MD  predniSONE (DELTASONE) 20 MG tablet Take 3 tablets (60 mg total) daily by mouth. 07/08/17   Tyrone NineGrunz, Ryan B, MD    Family History Family History  Problem Relation Age of Onset  . Diabetes Mother     Social History Social History   Tobacco Use  . Smoking status: Never Smoker  . Smokeless tobacco: Never Used  Substance Use Topics  . Alcohol use: Yes    Comment: one drink a week   . Drug use: No     Allergies   Patient has no known allergies.   Review of Systems Review of Systems  All other systems reviewed and are negative.    Physical Exam Updated Vital Signs BP (!) 162/99 (BP Location: Left Arm)   Pulse (!) 51   Temp 98.1 F (36.7 C) (Oral)   Resp 14   Ht 5\' 8"  (1.727 m)   Wt 63.5 kg   SpO2 100%   BMI 21.29 kg/m   Physical Exam  Constitutional: He is oriented to person, place, and time. He appears well-developed and well-nourished.  HENT:  Head: Normocephalic and atraumatic.  Right Ear: External ear normal.  Left Ear: External ear normal.  Eyes: Pupils are equal, round, and reactive to light. Conjunctivae and EOM are normal.  Neck: Normal range of motion. Neck supple.  No pain with neck  flexion, no meningismus  Cardiovascular: Normal rate, regular rhythm and normal heart sounds. Exam reveals no gallop and no friction rub.  No murmur heard. Pulmonary/Chest: Effort normal and breath sounds normal. No respiratory distress. He has no wheezes. He has no rales. He exhibits no tenderness.  Abdominal: Soft. He exhibits no distension and no mass. There is no tenderness. There is no rebound and no guarding.  Musculoskeletal: Normal range of motion. He exhibits no edema or tenderness.  Normal gait.  Neurological: He is alert and oriented to person, place, and time. He has normal reflexes.  CN 3-12 intact, normal finger to nose, no pronator drift, sensation and strength intact bilaterally.  Skin: Skin is warm and dry.    Psychiatric: He has a normal mood and affect. His behavior is normal. Judgment and thought content normal.  Nursing note and vitals reviewed.    ED Treatments / Results  Labs (all labs ordered are listed, but only abnormal results are displayed) Labs Reviewed  CBC WITH DIFFERENTIAL/PLATELET  BASIC METABOLIC PANEL    EKG None  Radiology No results found.  Procedures Procedures (including critical care time)  Medications Ordered in ED Medications  ketorolac (TORADOL) 30 MG/ML injection 30 mg (has no administration in time range)     Initial Impression / Assessment and Plan / ED Course  I have reviewed the triage vital signs and the nursing notes.  Pertinent labs & imaging results that were available during my care of the patient were reviewed by me and considered in my medical decision making (see chart for details).     Pt HA treated and improved while in ED.  Presentation is like pts typical HA and non concerning for [facility], ICH, Meningitis, or temporal arteritis. Pt is afebrile with no focal neuro deficits, nuchal rigidity, or change in vision. Pt is to follow up with PCP to discuss prophylactic medication. Pt verbalizes understanding and is agreeable with plan to dc.    Final Clinical Impressions(s) / ED Diagnoses   Final diagnoses:  Acute nonintractable headache, unspecified headache type    ED Discharge Orders    None       Roxy Horseman, PA-C 04/02/18 0559    Azalia Bilis, MD 04/02/18 [phone]

## 2018-04-02 NOTE — ED Triage Notes (Signed)
Patient reports intermittent frontal headache for several weeks , denies head injury , no fever or chills , alert and oriented , no blurred vision or photophobia .

## 2018-04-02 NOTE — ED Notes (Signed)
Pt refused discharge vitals 

## 2018-10-15 ENCOUNTER — Emergency Department (HOSPITAL_COMMUNITY)
Admission: EM | Admit: 2018-10-15 | Discharge: 2018-10-15 | Disposition: A | Discharge status: Home / Self Care | Payer: Medicaid Other | Attending: Emergency Medicine | Admitting: Emergency Medicine

## 2018-10-15 ENCOUNTER — Encounter (HOSPITAL_COMMUNITY): Payer: Self-pay | Admitting: Emergency Medicine

## 2018-10-15 DIAGNOSIS — K047 Periapical abscess without sinus: Secondary | ICD-10-CM

## 2018-10-15 DIAGNOSIS — K029 Dental caries, unspecified: Secondary | ICD-10-CM | POA: Insufficient documentation

## 2018-10-15 MED ORDER — HYDROCODONE-ACETAMINOPHEN 5-325 MG PO TABS: 1.0000 | ORAL_TABLET | ORAL | 0 refills | Status: AC | PRN

## 2018-10-15 MED ORDER — HYDROCODONE-ACETAMINOPHEN 5-325 MG PO TABS
1.0000 | ORAL_TABLET | Freq: Once | ORAL | Status: AC
Start: 2018-10-15 — End: 2018-10-15
  Administered 2018-10-15: 1 via ORAL
  Filled 2018-10-15: qty 1

## 2018-10-15 MED ORDER — NAPROXEN 500 MG PO TABS: 500.0000 mg | ORAL_TABLET | Freq: Two times a day (BID) | ORAL | 0 refills | Status: DC

## 2018-10-15 MED ORDER — PENICILLIN V POTASSIUM 250 MG PO TABS: 250.0000 mg | ORAL_TABLET | Freq: Four times a day (QID) | ORAL | 0 refills | Status: AC

## 2018-10-15 NOTE — ED Triage Notes (Addendum)
Pt reports left lower dental pain x 3 days, reports swelling on his neck, no swelling noted by this RN.

## 2018-10-15 NOTE — Discharge Instructions (Addendum)
Continue to gargle with salt water and take the antibiotics

## 2018-10-15 NOTE — ED Provider Notes (Signed)
[facility] EMERGENCY DEPARTMENT Provider Note   CSN: [id number] Arrival date & time: 10/15/18  1123    History   Chief Complaint Chief Complaint  Patient presents with  . Dental Pain    HPI [name] is a 23 y.o. male.     The history is provided by the patient.  Dental Pain  Location:  Lower Lower teeth location:  17/LL 3rd molar and 18/LL 2nd molar Quality:  Constant, localized, pulsating, sharp and radiating Severity:  Severe Onset quality:  Gradual Duration:  3 days Timing:  Constant Progression:  Worsening Chronicity:  New Context: abscess and dental caries   Previous work-up:  Root canal Relieved by:  Nothing Worsened by:  Cold food/drink, hot food/drink, jaw movement and touching Ineffective treatments:  Ice Associated symptoms: facial pain, facial swelling and gum swelling   Associated symptoms: no congestion, no difficulty swallowing, no drooling, no fever, no headaches, no neck pain, no neck swelling and no trismus   Risk factors comment:  Prior hx of nephrotic syndrome   Past Medical History:  Diagnosis Date  . 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 Nephrotic syndrome    Diagnosed at age 23    Patient Active Problem List   Diagnosis Date Noted  . Hyperkalemia 07/07/2017  . Gastroenteritis 07/07/2017  . 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 RENAL BIOPSY     At [facility] Medications    Prior to Admission medications   Medication Sig Start Date End Date Taking? Authorizing Provider  HYDROcodone-acetaminophen (NORCO/VICODIN) 5-325 MG tablet Take 1-2 tablets every 6 (six) hours as needed by mouth. Patient not taking: Reported on 04/02/2018 07/11/17   Roxy Horseman, PA-C  naproxen (NAPROSYN) 500 MG tablet Take 1 tablet (500 mg total) by mouth 2 (two) times daily. 10/15/18   Gwyneth Sprout, MD  omeprazole (PRILOSEC) 40  MG capsule Take 1 capsule (40 mg total) daily by mouth. Patient not taking: Reported on 04/02/2018 07/08/17 04/02/26  Tyrone Nine, MD  ondansetron (ZOFRAN) 4 MG tablet Take 1 tablet (4 mg total) every 6 (six) hours by mouth. Patient not taking: Reported on 07/11/2017 07/05/17   Gwyneth Sprout, MD  ondansetron (ZOFRAN) 4 MG tablet Take 1 tablet (4 mg total) every 6 (six) hours as needed by mouth for nausea. Patient not taking: Reported on 04/02/2018 07/08/17   Tyrone Nine, MD  penicillin v potassium (VEETID) 250 MG tablet Take 1 tablet (250 mg total) by mouth 4 (four) times daily for 7 days. 10/15/18 10/22/18  Gwyneth Sprout, MD  predniSONE (DELTASONE) 20 MG tablet Take 3 tablets (60 mg total) daily by mouth. Patient not taking: Reported on 04/02/2018 07/08/17   Tyrone Nine, MD    Family History Family History  Problem Relation Age of Onset  . Diabetes Mother     Social History Social History   Tobacco Use  . Smoking status: Never Smoker  . Smokeless tobacco: Never Used  Substance Use Topics  . Alcohol use: Yes    Comment: one drink a week   . Drug use: No     Allergies   Patient has no known allergies.   Review of Systems Review of Systems  Constitutional: Negative for fever.  HENT: Positive for facial swelling. Negative for congestion and drooling.   Musculoskeletal: Negative for neck pain.  Neurological:  Negative for headaches.  All other systems reviewed and are negative.    Physical Exam Updated Vital Signs BP (!) 144/85 (BP Location: Right Arm)   Pulse 72   Temp 98 F (36.7 C) (Oral)   Resp 17   SpO2 99%   Physical Exam Vitals signs and nursing note reviewed.  Constitutional:      General: He is not in acute distress.    Appearance: Normal appearance. He is normal weight.  HENT:     Head: Normocephalic.     Mouth/Throat:     Mouth: Mucous membranes are moist.   Eyes:     Pupils: Pupils are equal, round, and reactive to light.  Cardiovascular:      Rate and Rhythm: Normal rate.  Pulmonary:     Effort: Pulmonary effort is normal.  Skin:    General: Skin is warm.  Neurological:     General: No focal deficit present.     Mental Status: He is alert. Mental status is at baseline.  Psychiatric:        Mood and Affect: Mood normal.        Behavior: Behavior normal.      ED Treatments / Results  Labs (all labs ordered are listed, but only abnormal results are displayed) Labs Reviewed - No data to display  EKG None  Radiology No results found.  Procedures Procedures (including critical care time)  Medications Ordered in ED Medications  HYDROcodone-acetaminophen (NORCO/VICODIN) 5-325 MG per tablet 1 tablet (has no administration in time range)     Initial Impression / Assessment and Plan / ED Course  I have reviewed the triage vital signs and the nursing notes.  Pertinent labs & imaging results that were available during my care of the patient were reviewed by me and considered in my medical decision making (see chart for details).       Pt with dental caries and facial swelling.  No signs of ludwig's angina or difficulty swallowing and no systemic symptoms. Will treat with PCN and vicodin as pt can't have naproxen due to prior nephrotic sx.  have pt f/u with dentist.   Final Clinical Impressions(s) / ED Diagnoses   Final diagnoses:  Pain due to dental caries  Dental infection    ED Discharge Orders         Ordered    penicillin v potassium (VEETID) 250 MG tablet  4 times daily     10/15/18 1156    naproxen (NAPROSYN) 500 MG tablet  2 times daily,   Status:  Discontinued     10/15/18 1156    HYDROcodone-acetaminophen (NORCO/VICODIN) 5-325 MG tablet  Every 4 hours PRN     10/15/18 1206           Gwyneth Sprout, MD 10/15/18 1209

## 2018-10-15 NOTE — ED Notes (Signed)
Discharge instructions and prescriptions discussed with Pt. Pt verbalized understanding. Pt stable and ambulatory.   

## 2020-04-26 ENCOUNTER — Encounter: Payer: Self-pay | Admitting: Pediatrics

## 2020-09-07 ENCOUNTER — Other Ambulatory Visit: Payer: Self-pay

## 2020-09-07 ENCOUNTER — Other Ambulatory Visit: Payer: Medicaid Other

## 2020-09-07 DIAGNOSIS — Z20822 Contact with and (suspected) exposure to covid-19: Secondary | ICD-10-CM

## 2020-09-09 LAB — NOVEL CORONAVIRUS, NAA: SARS-CoV-2, NAA: DETECTED — AB

## 2020-09-09 LAB — SARS-COV-2, NAA 2 DAY TAT

## 2020-09-14 ENCOUNTER — Other Ambulatory Visit: Payer: Medicaid Other

## 2020-09-17 ENCOUNTER — Other Ambulatory Visit: Payer: Medicaid Other

## 2020-09-17 DIAGNOSIS — Z20822 Contact with and (suspected) exposure to covid-19: Secondary | ICD-10-CM

## 2020-09-19 LAB — NOVEL CORONAVIRUS, NAA: SARS-CoV-2, NAA: DETECTED — AB

## 2020-09-19 LAB — SARS-COV-2, NAA 2 DAY TAT

## 2020-12-31 ENCOUNTER — Other Ambulatory Visit: Payer: Self-pay

## 2020-12-31 ENCOUNTER — Encounter (HOSPITAL_COMMUNITY): Payer: Self-pay

## 2020-12-31 ENCOUNTER — Ambulatory Visit (HOSPITAL_COMMUNITY)
Admission: EM | Admit: 2020-12-31 | Discharge: 2020-12-31 | Disposition: A | Discharge status: Home / Self Care | Payer: Medicaid Other | Attending: Emergency Medicine | Admitting: Emergency Medicine

## 2020-12-31 DIAGNOSIS — L03211 Cellulitis of face: Secondary | ICD-10-CM

## 2020-12-31 MED ORDER — AMOXICILLIN-POT CLAVULANATE 875-125 MG PO TABS: 1.0000 | ORAL_TABLET | Freq: Two times a day (BID) | ORAL | 0 refills | Status: DC

## 2020-12-31 NOTE — Discharge Instructions (Addendum)
f erythema becomes or any vision change worse after 24 hours instructed to go to er  Will need to take full dose of abx with food  Take nsaids as needed for pain

## 2020-12-31 NOTE — ED Triage Notes (Signed)
Pt presents with swelling around the nose and left eye x 3 days. He states yesterday morning the swelling got worse. He states there is a bump beside his nose that is causing discomfort around his eye. The pt states it is painful to the touch.

## 2020-12-31 NOTE — ED Provider Notes (Signed)
[facility]    CSN: [id number] Arrival date & time: 12/31/20  1025      History   Chief Complaint Chief Complaint  Patient presents with  . Facial Swelling    eye    HPI [name] is a 25 y.o. male.   Pt noticed a bump / pimple to side of nose area 2 days ago then had swelling under the left eye this morning . Has had swelling and pain. Wears glasses and thinks that he either rubbed the area or the glasses rubbed the area. States that he was taking motrin with no relief. Denies any vision problems. 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 Denies any eye pressure . No none injury      Past Medical History:  Diagnosis Date  . 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 Nephrotic syndrome    Diagnosed at age 33    Patient Active Problem List   Diagnosis Date Noted  . Hyperkalemia 07/07/2017  . Gastroenteritis 07/07/2017  . 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 RENAL BIOPSY     At [facility] Medications    Prior to Admission medications   Medication Sig Start Date End Date Taking? Authorizing Provider  amoxicillin-clavulanate (AUGMENTIN) 875-125 MG tablet Take 1 tablet by mouth every 12 (twelve) hours. 12/31/20  Yes Coralyn Mark, NP  HYDROcodone-acetaminophen (NORCO/VICODIN) 5-325 MG tablet Take 1-2 tablets by mouth every 4 (four) hours as needed. 10/15/18   Gwyneth Sprout, MD  omeprazole (PRILOSEC) 40 MG capsule Take 1 capsule (40 mg total) daily by mouth. Patient not taking: Reported on 04/02/2018 07/08/17 04/02/26  Tyrone Nine, MD  ondansetron (ZOFRAN) 4 MG tablet Take 1 tablet (4 mg total) every 6 (six) hours by mouth. Patient not taking: Reported on 07/11/2017 07/05/17   Gwyneth Sprout, MD  ondansetron (ZOFRAN) 4 MG tablet Take 1 tablet (4 mg total) every 6 (six) hours as needed by mouth for  nausea. Patient not taking: Reported on 04/02/2018 07/08/17   Tyrone Nine, MD  predniSONE (DELTASONE) 20 MG tablet Take 3 tablets (60 mg total) daily by mouth. Patient not taking: Reported on 04/02/2018 07/08/17   Tyrone Nine, MD    Family History Family History  Problem Relation Age of Onset  . Diabetes Mother     Social History Social History   Tobacco Use  . Smoking status: Never Smoker  . Smokeless tobacco: Never Used  Substance Use Topics  . Alcohol use: Yes    Comment: one drink a week   . Drug use: No     Allergies   Tylenol [acetaminophen]   Review of Systems Review of Systems  Constitutional: Negative.   HENT:       Wears glasses no change in vision .   Eyes:       Swelling under lt eye area and lateral of lt side bridge of nose.   Respiratory: Negative.   Cardiovascular: Negative.   Gastrointestinal: Negative.   Skin: Positive for rash.       Redness and swelling under lt eye and to area on side of nose   Neurological: Negative.      Physical Exam Triage Vital Signs ED Triage Vitals  Enc Vitals Group     BP 12/31/20  1148 (!) 141/88     Pulse Rate 12/31/20 1150 63     Resp 12/31/20 1148 20     Temp 12/31/20 1148 98.2 F (36.8 C)     Temp Source 12/31/20 1148 Oral     SpO2 12/31/20 1148 99 %     Weight --      Height --      Head Circumference --      Peak Flow --      Pain Score 12/31/20 1147 10     Pain Loc --      Pain Edu? --      Excl. in GC? --    No data found.  Updated Vital Signs BP (!) 141/88 (BP Location: Right Arm)   Pulse 63   Temp 98.2 F (36.8 C) (Oral)   Resp 20   SpO2 99%   Visual Acuity Right Eye Distance:   Left Eye Distance:   Bilateral Distance:    Right Eye Near:   Left Eye Near:    Bilateral Near:     Physical Exam Constitutional:      Appearance: Normal appearance.  HENT:     Nose:     Comments: Lt lateral bridge of nose small pin point erythema and no opening no drainage.     Mouth/Throat:      Mouth: Mucous membranes are moist.  Eyes:     Pupils: Pupils are equal, round, and reactive to light.     Comments: Small amount of lt under eye edema slight erythema painful to touch   Cardiovascular:     Rate and Rhythm: Normal rate.     Pulses: Normal pulses.  Pulmonary:     Effort: Pulmonary effort is normal.  Skin:    Findings: Erythema and rash present.  Neurological:     General: No focal deficit present.     Mental Status: He is alert.      UC Treatments / Results  Labs (all labs ordered are listed, but only abnormal results are displayed) Labs Reviewed - No data to display  EKG   Radiology No results found.  Procedures Procedures (including critical care time)  Medications Ordered in UC Medications - No data to display  Initial Impression / Assessment and Plan / UC Course  I have reviewed the triage vital signs and the nursing notes.  Pertinent labs & imaging results that were available during my care of the patient were reviewed by me and considered in my medical decision making (see chart for details).     If erythema becomes or any vision change worse after 24 hours instructed to go to er  Will need to take full dose of abx with food  Take nsaids as needed for pain   Final Clinical Impressions(s) / UC Diagnoses   Final diagnoses:  Facial cellulitis     Discharge Instructions     f erythema becomes or any vision change worse after 24 hours instructed to go to er  Will need to take full dose of abx with food  Take nsaids as needed for pain    ED Prescriptions    Medication Sig Dispense Auth. Provider   amoxicillin-clavulanate (AUGMENTIN) 875-125 MG tablet Take 1 tablet by mouth every 12 (twelve) hours. 14 tablet Coralyn Mark, NP     PDMP not reviewed this encounter.   Coralyn Mark, NP 12/31/20 1218

## 2021-07-26 ENCOUNTER — Encounter (HOSPITAL_BASED_OUTPATIENT_CLINIC_OR_DEPARTMENT_OTHER): Payer: Self-pay | Admitting: Emergency Medicine

## 2021-07-26 ENCOUNTER — Emergency Department (HOSPITAL_BASED_OUTPATIENT_CLINIC_OR_DEPARTMENT_OTHER)
Admission: EM | Admit: 2021-07-26 | Discharge: 2021-07-26 | Disposition: A | Discharge status: Home / Self Care | Payer: Self-pay | Attending: Emergency Medicine | Admitting: Emergency Medicine

## 2021-07-26 ENCOUNTER — Other Ambulatory Visit: Payer: Self-pay

## 2021-07-26 DIAGNOSIS — Z20822 Contact with and (suspected) exposure to covid-19: Secondary | ICD-10-CM | POA: Insufficient documentation

## 2021-07-26 DIAGNOSIS — H6691 Otitis media, unspecified, right ear: Secondary | ICD-10-CM | POA: Insufficient documentation

## 2021-07-26 DIAGNOSIS — J069 Acute upper respiratory infection, unspecified: Secondary | ICD-10-CM | POA: Insufficient documentation

## 2021-07-26 LAB — RESP PANEL BY RT-PCR (FLU A&B, COVID) ARPGX2
Influenza A by PCR: NEGATIVE
Influenza B by PCR: NEGATIVE
SARS Coronavirus 2 by RT PCR: NEGATIVE

## 2021-07-26 MED ORDER — AMOXICILLIN-POT CLAVULANATE 875-125 MG PO TABS: 1.0000 | ORAL_TABLET | Freq: Two times a day (BID) | ORAL | 0 refills | Status: AC

## 2021-07-26 NOTE — ED Provider Notes (Signed)
[place] EMERGENCY DEPT Provider Note   CSN: [id number] Arrival date & time: 07/26/21  1213     History Chief Complaint  Patient presents with   Otalgia   Sore Throat    [name] is a 25 y.o. male.  Patient presents to the emergency department with 1 day of throat, productive cough, congestion, and right ear pain.  He says he has felt very tired since then.  This morning he woke up with right ear otalgia that has been constant.  He states his symptoms are severe.  He endorses a subjective fevers, however does not have any objective readings at home.  He denies any chest pain, shortness of breath, abdominal pain, nausea, or vomiting.   Otalgia Associated symptoms: congestion, cough, fever and sore throat   Associated symptoms: no abdominal pain, no diarrhea, no headaches, no rash, no rhinorrhea and no vomiting   Sore Throat Pertinent negatives include no chest pain, no abdominal pain, no headaches and no shortness of breath.      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 Authorizing Provider  amoxicillin-clavulanate (AUGMENTIN) 875-125 MG tablet Take 1 tablet by mouth every 12 (twelve) hours for 5 days. 07/26/21 07/31/21 Yes Dallin Mccorkel, Adora Fridge, PA-C   HYDROcodone-acetaminophen (NORCO/VICODIN) 5-325 MG tablet Take 1-2 tablets by mouth every 4 (four) hours as needed. 10/15/18   Blanchie Dessert, MD  omeprazole (PRILOSEC) 40 MG capsule Take 1 capsule (40 mg total) daily by mouth. Patient not taking: Reported on 04/02/2018 07/08/17 04/02/26  Patrecia Pour, MD  ondansetron (ZOFRAN) 4 MG tablet Take 1 tablet (4 mg total) every 6 (six) hours by mouth. Patient not taking: Reported on 07/11/2017 07/05/17   Blanchie Dessert, MD  ondansetron (ZOFRAN) 4 MG tablet Take 1 tablet (4 mg total) every 6 (six) hours as needed by mouth for nausea. Patient not taking: Reported on 04/02/2018 07/08/17   Patrecia Pour, MD  predniSONE (DELTASONE) 20 MG tablet Take 3 tablets (60 mg total) daily by mouth. Patient not taking: Reported on 04/02/2018 07/08/17   Patrecia Pour, MD    Allergies    Tylenol [acetaminophen]  Review of Systems   Review of Systems  Constitutional:  Positive for chills, fatigue and fever.  HENT:  Positive for congestion, ear pain and sore throat. Negative for rhinorrhea.   Eyes:  Negative for visual disturbance.  Respiratory:  Positive for cough. Negative for chest tightness and shortness of breath.   Cardiovascular:  Negative for chest pain, palpitations and leg swelling.  Gastrointestinal:  Negative for abdominal pain, blood in stool, constipation, diarrhea, nausea and vomiting.  Genitourinary:  Negative for dysuria, flank pain and hematuria.  Musculoskeletal:  Negative for back pain.  Skin:  Negative for rash and wound.  Neurological:  Negative for dizziness, syncope, weakness, light-headedness and headaches.  Psychiatric/Behavioral:  Negative for confusion.   All other systems reviewed and are negative.  Physical Exam Updated Vital Signs BP (!) 148/91 (BP Location: Right Arm)   Pulse 71   Temp 98.8 F (37.1 C)   Resp 18   SpO2 99%   Physical Exam Vitals and nursing note reviewed.  Constitutional:      General: He is not in acute  distress.    Appearance: Normal appearance. He is well-developed. He is not ill-appearing, toxic-appearing or diaphoretic.  HENT:     Head: Normocephalic and atraumatic.     Right Ear: Ear canal normal. No drainage, swelling or tenderness. No middle ear effusion. Tympanic membrane is erythematous.     Left Ear: Tympanic membrane and ear canal normal. No drainage, swelling or tenderness.  No middle ear effusion. Tympanic membrane is not erythematous.     Ears:     Comments: Right TM with mild erythema.  There is not appear to be any effusion.  Ear canal appears normal.  No tenderness to external ear.    Nose: No nasal deformity, congestion or rhinorrhea.     Mouth/Throat:     Lips: Pink. No lesions.     Mouth: Mucous membranes are moist. No oral lesions.     Pharynx: Oropharynx is clear. Uvula midline. No pharyngeal swelling, oropharyngeal exudate, posterior oropharyngeal erythema or uvula swelling.     Tonsils: No tonsillar exudate or tonsillar abscesses.     Comments: No tonsillar swelling, exudate, or abscess noted.  Uvula is midline.  Posterior pharynx with no exudates, swelling, or erythema. Eyes:     General: Gaze aligned appropriately. No scleral icterus.       Right eye: No discharge.        Left eye: No discharge.     Conjunctiva/sclera: Conjunctivae normal.     Right eye: Right conjunctiva is not injected. No exudate or hemorrhage.    Left eye: Left conjunctiva is not injected. No exudate or hemorrhage. Cardiovascular:     Rate and Rhythm: Normal rate and regular rhythm.     Heart sounds: Normal heart sounds. No murmur heard.   No friction rub. No gallop.  Pulmonary:     Effort: Pulmonary effort is normal. No respiratory distress.     Breath sounds: Normal breath sounds. No stridor. No wheezing, rhonchi or rales.  Abdominal:     General: There is no distension.     Palpations: Abdomen is soft.     Tenderness: There is no abdominal tenderness. There is no guarding or rebound.   Musculoskeletal:     Cervical back: Neck supple.  Lymphadenopathy:     Cervical: No cervical adenopathy.  Skin:    General: Skin is warm and dry.     Coloration: Skin is not pale.     Findings: No erythema or rash.  Neurological:     Mental Status: He is alert and oriented to person, place, and time.  Psychiatric:        Mood and Affect: Mood normal.        Speech: Speech normal.        Behavior: Behavior normal. Behavior is cooperative.    ED Results / Procedures / Treatments   Labs (all labs ordered are listed, but only abnormal results are displayed)  Labs Reviewed  RESP PANEL BY RT-PCR (FLU A&B, COVID) ARPGX2    EKG None  Radiology No results found.  Procedures Procedures   Medications Ordered in ED Medications - No data to display  ED Course  I have reviewed the triage vital signs and the nursing notes.  Pertinent labs & imaging results that were available during my care of the patient were reviewed by me and considered in my medical decision making (see chart for details).    MDM Rules/Calculators/A&P                         25 year old male who presents the emergency department with 1 day of upper respiratory symptoms and right ear pain.  Afebrile and vitals are stable.  Low suspicion for PTA or RPA given exam findings.  Did find evidence of right otitis media with some erythema of the TM.  No mastoid tenderness to suggest mastoiditis.  With no exudates and tonsils or posterior pharynx, do not suspect strep throat or other bacterial pharyngitis.  Tested negative for influenza and COVID.  Plan to discharge home with supportive treatment of upper respiratory symptoms.  Also placed on 5 days of Augmentin for right otitis media.  Return precautions provided.  Final Clinical Impression(s) / ED Diagnoses Final diagnoses:  Right otitis media, unspecified otitis media type  Upper respiratory infection, viral    Rx / DC Orders ED Discharge Orders           Ordered    amoxicillin-clavulanate (AUGMENTIN) 875-125 MG tablet  Every 12 hours        07/26/21 1341             Claudie Leach, PA-C 07/26/21 1533    Milagros Loll, MD 07/27/21 [phone]

## 2021-07-26 NOTE — ED Triage Notes (Signed)
Pt presents to ED POV. Pt c/o R  ear otalgia, sore throat, and tactile fever since yesterday.

## 2021-07-26 NOTE — Discharge Instructions (Signed)

## 2022-01-23 ENCOUNTER — Other Ambulatory Visit: Payer: Self-pay

## 2022-01-23 ENCOUNTER — Emergency Department (HOSPITAL_BASED_OUTPATIENT_CLINIC_OR_DEPARTMENT_OTHER): Payer: Self-pay

## 2022-01-23 ENCOUNTER — Emergency Department (HOSPITAL_BASED_OUTPATIENT_CLINIC_OR_DEPARTMENT_OTHER)
Admission: EM | Admit: 2022-01-23 | Discharge: 2022-01-23 | Disposition: A | Discharge status: Home / Self Care | Payer: Self-pay | Attending: Emergency Medicine | Admitting: Emergency Medicine

## 2022-01-23 DIAGNOSIS — M79645 Pain in left finger(s): Secondary | ICD-10-CM | POA: Insufficient documentation

## 2022-01-23 DIAGNOSIS — L089 Local infection of the skin and subcutaneous tissue, unspecified: Secondary | ICD-10-CM | POA: Insufficient documentation

## 2022-01-23 MED ORDER — DOXYCYCLINE HYCLATE 100 MG PO CAPS: 100.0000 mg | ORAL_CAPSULE | Freq: Two times a day (BID) | ORAL | 0 refills | Status: AC

## 2022-01-23 NOTE — Discharge Instructions (Addendum)
Take the antibiotics to help with your symptoms. You can take Tylenol or Motrin as needed to help with your pain or swelling. Follow-up with the hand specialist listed below. Return to the ER if you start to experience worsening pain or swelling, pus draining from the area, numbness or trouble moving your finger

## 2022-01-23 NOTE — ED Triage Notes (Signed)
Patient reports x2 weeks ago he fell down a hill and he is still having pain in the left middle finger.
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
## 2023-11-28 IMAGING — DX DG HAND COMPLETE 3+V*L*
3 series · 3 of 3 positions shown · non-contrast
Comparison: None available

CLINICAL DATA: Third digit pain status post fall down [place] 2 weeks
ago

EXAM:
LEFT HAND - COMPLETE 3+ VIEW

[hand ap]
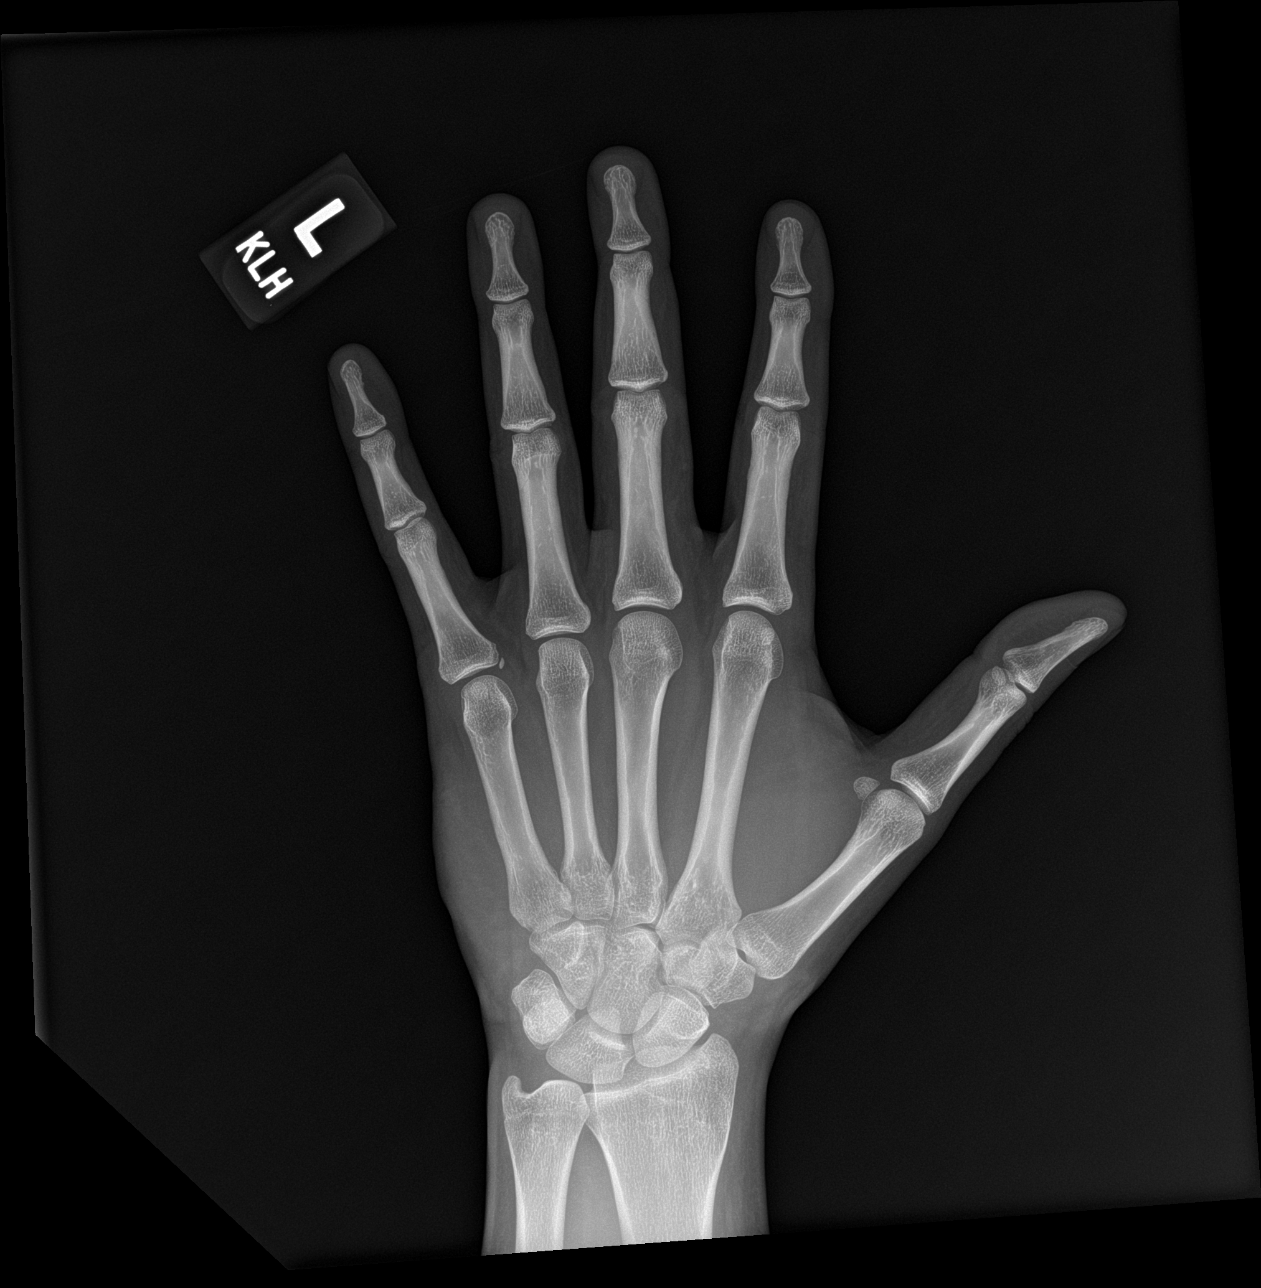

[hand obl]
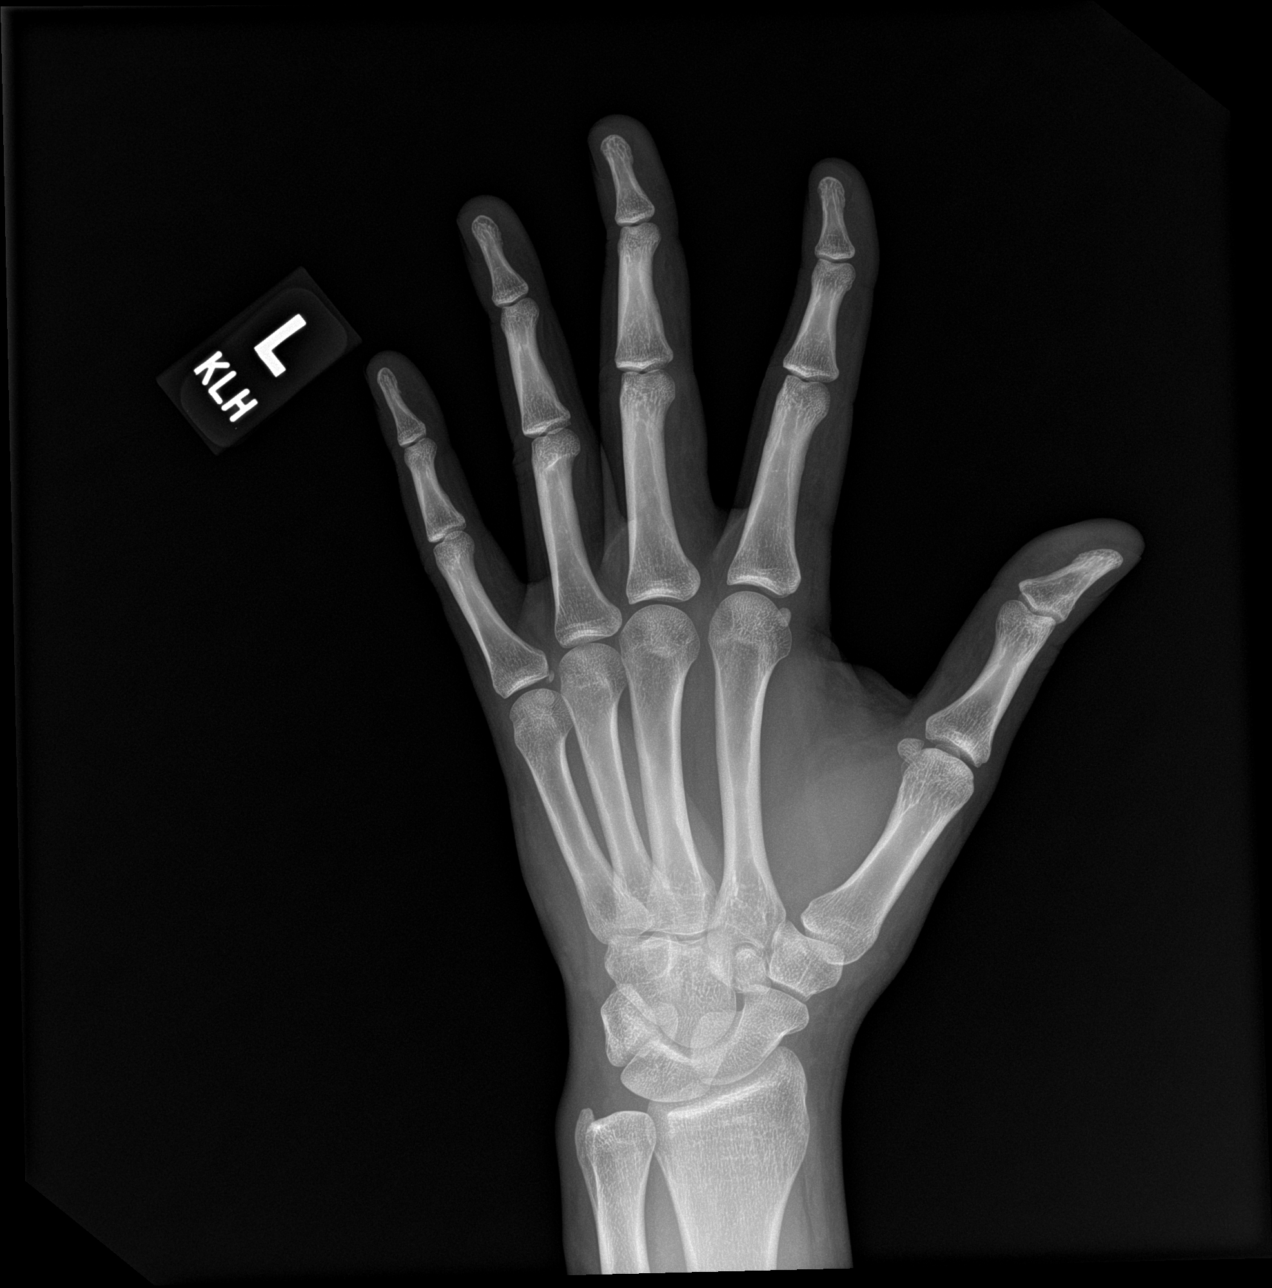

[hand lat]
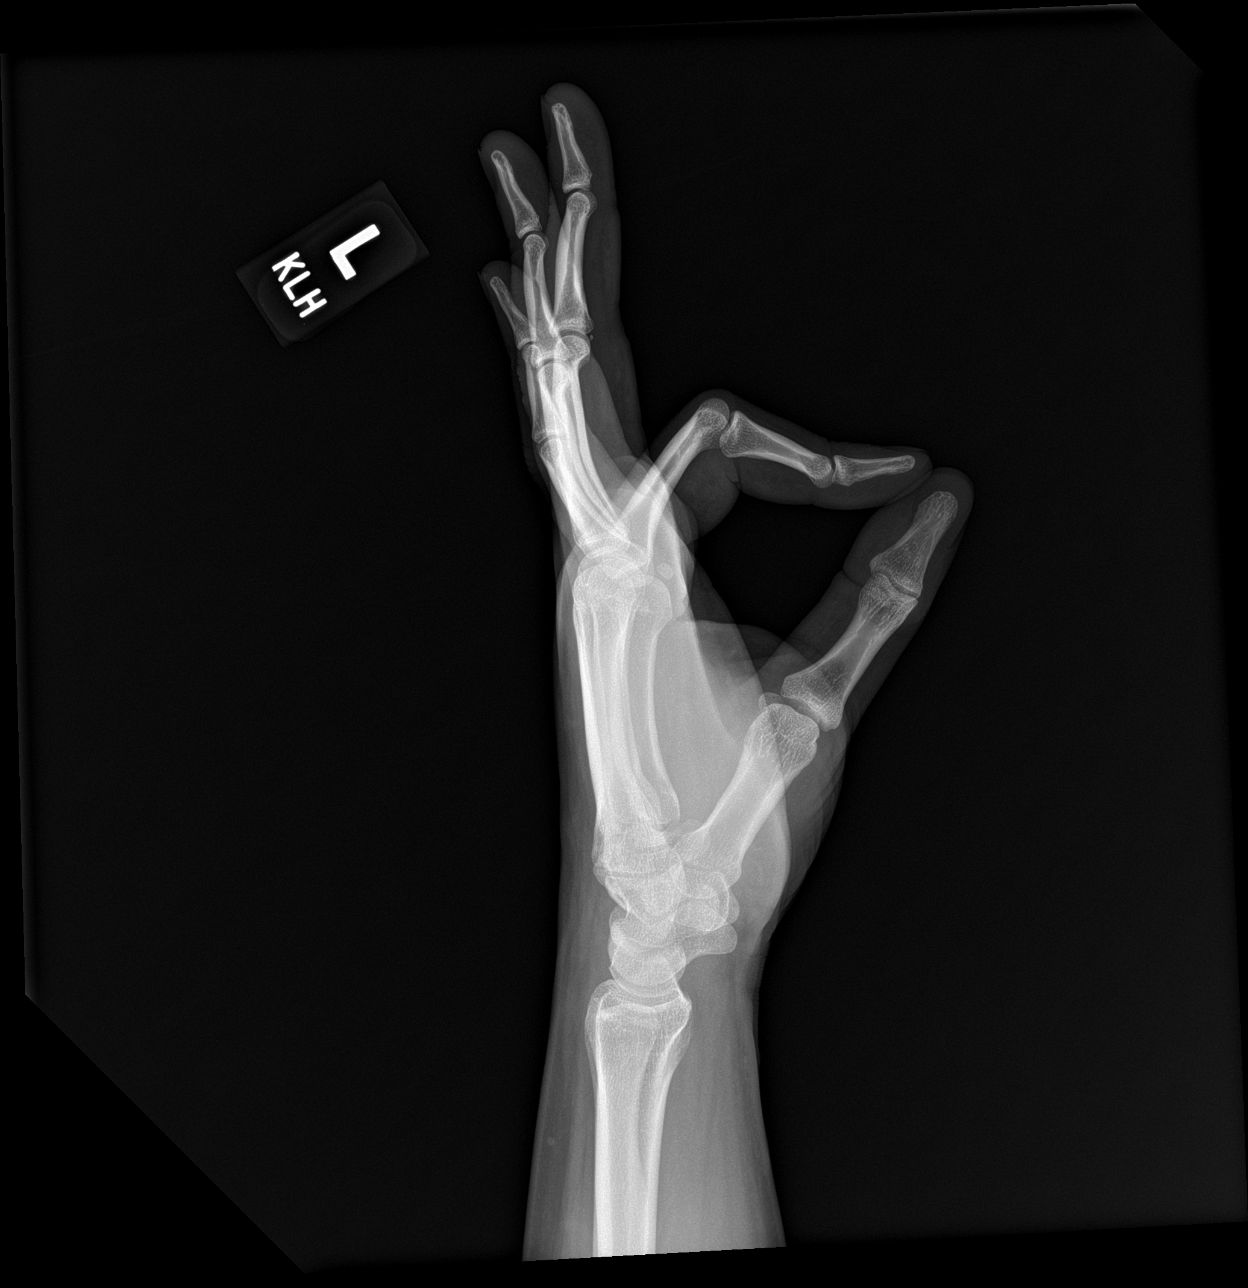

[3 of 3 positions shown; findings below may reference images not displayed]

FINDINGS: There is no evidence of fracture or dislocation. There is no
evidence of arthropathy or other focal bone abnormality. Soft
tissues are unremarkable.
IMPRESSION: No acute radiographic abnormality of the left hand.

## 2024-07-05 ENCOUNTER — Ambulatory Visit (HOSPITAL_COMMUNITY)
Admission: EM | Admit: 2024-07-05 | Discharge: 2024-07-05 | Disposition: A | Discharge status: Home / Self Care | Payer: Self-pay | Attending: Emergency Medicine | Admitting: Emergency Medicine

## 2024-07-05 ENCOUNTER — Encounter (HOSPITAL_COMMUNITY): Payer: Self-pay

## 2024-07-05 DIAGNOSIS — Z23 Encounter for immunization: Secondary | ICD-10-CM

## 2024-07-05 DIAGNOSIS — S0590XA Unspecified injury of unspecified eye and orbit, initial encounter: Secondary | ICD-10-CM

## 2024-07-05 DIAGNOSIS — S0501XA Injury of conjunctiva and corneal abrasion without foreign body, right eye, initial encounter: Secondary | ICD-10-CM

## 2024-07-05 MED ORDER — TETRACAINE HCL 0.5 % OP SOLN
OPHTHALMIC | Status: AC
  Filled 2024-07-05: qty 4

## 2024-07-05 MED ORDER — TETANUS-DIPHTH-ACELL PERTUSSIS 5-2-15.5 LF-MCG/0.5 IM SUSP
INTRAMUSCULAR | Status: AC
  Filled 2024-07-05: qty 0.5

## 2024-07-05 MED ORDER — FLUORESCEIN SODIUM 1 MG OP STRP
ORAL_STRIP | OPHTHALMIC | Status: AC
  Filled 2024-07-05: qty 1

## 2024-07-05 MED ORDER — TETANUS-DIPHTH-ACELL PERTUSSIS 5-2-15.5 LF-MCG/0.5 IM SUSP
0.5000 mL | Freq: Once | INTRAMUSCULAR | Status: AC
  Administered 2024-07-05: 0.5 mL via INTRAMUSCULAR

## 2024-07-05 MED ORDER — POLYMYXIN B-TRIMETHOPRIM 10000-0.1 UNIT/ML-% OP SOLN: 1.0000 [drp] | Freq: Four times a day (QID) | OPHTHALMIC | 0 refills | Status: AC

## 2024-07-05 NOTE — ED Notes (Signed)
 Visual Acuity: Bilateral Distance: 20/25 (Without correction, Denies history of correction) R Distance: 20/70 (Without correction, Denies history of correction) L Distance: 20/25 (Without correction, Denies history of correction)

## 2024-07-05 NOTE — Discharge Instructions (Signed)
 Use the antibiotic eye drops 4x daily for 5 days to help prevent any infection of the eye.  Continue to ice the area.  Ibuprofen  800 mg every 8 hours can help with pain and inflammation.  Bruising and swelling will most likely worsen over the next 24 hours.  Pain should improve over the next day or so as the abrasion heals.  Seek immediate care with an ophthalmologist if you develop any vision changes.

## 2024-07-05 NOTE — ED Triage Notes (Addendum)
 Onset around 0920 today Patient was working on his car when his nephew accidentally dropped a wrench on his right eye.   Right eye is red painful, and swollen. Patient states unable to keep the eye open for long. Tried otc eye drops, cold compress, and Aleve  with little relief.

## 2024-07-05 NOTE — ED Provider Notes (Signed)
 [facility]    CSN: [id number] Arrival date & time: 07/05/24  1420      History   Chief Complaint Chief Complaint  Patient presents with   Eye Injury    HPI [name] is a 28 y.o. male.   Patient presents to clinic over concern of right eye injury.  Around 930 this morning he was working under a car and dropped a pair of pliers under his right eye.  Immediately afterwards he had pain.  Took an Aleve .  He has been icing the eye which has helped with some of the swelling, continues to be swollen and bruised.  Denies any vision changes but is having trouble keeping his eye open due to pain and swelling.  Does not wear contacts or glasses.  Unsure when last Tdap was.  The history is provided by the patient and medical records.  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 Authorizing Provider  trimethoprim-polymyxin b (POLYTRIM) ophthalmic solution Place 1 drop into the right eye every 6 (six) hours for 5 days. 07/05/24 07/10/24 Yes Pink Maye  N, FNP  doxycycline  (VIBRAMYCIN ) 100 MG capsule Take 1 capsule (100 mg total) by mouth 2 (two) times daily. 01/23/22   Khatri, Hina, PA-C  HYDROcodone -acetaminophen  (NORCO/VICODIN) 5-325 MG tablet Take 1-2 tablets by mouth every 4 (four) hours as needed. 10/15/18   Doretha Folks, MD  omeprazole  (PRILOSEC) 40 MG capsule Take 1 capsule (40 mg total) daily by mouth. Patient not taking: Reported on 04/02/2018 07/08/17 04/02/26  Bryn Bernardino NOVAK, MD  ondansetron   (ZOFRAN ) 4 MG tablet Take 1 tablet (4 mg total) every 6 (six) hours by mouth. Patient not taking: Reported on 07/11/2017 07/05/17   Doretha Folks, MD  ondansetron  (ZOFRAN ) 4 MG tablet Take 1 tablet (4 mg total) every 6 (six) hours as needed by mouth for nausea. Patient not taking: Reported on 04/02/2018 07/08/17   Bryn Bernardino NOVAK, MD  predniSONE  (DELTASONE ) 20 MG tablet Take 3 tablets (60 mg total) daily by mouth. Patient not taking: Reported on 04/02/2018 07/08/17   Bryn Bernardino NOVAK, MD    Family History Family History  Problem Relation Age of Onset   Diabetes Mother     Social History Social History   Tobacco Use   Smoking status: Never   Smokeless tobacco: Never  Vaping Use   Vaping status: Never Used  Substance Use Topics   Alcohol use: Yes    Comment: one drink a week    Drug use: Yes     Allergies   Tylenol  [acetaminophen ]   Review of Systems Review of Systems  Per HPI  Physical Exam Triage Vital Signs ED Triage Vitals  Encounter Vitals Group     BP 07/05/24 1533 (!) 149/97     Girls Systolic BP Percentile --      Girls Diastolic BP Percentile --      Boys Systolic BP Percentile --  Boys Diastolic BP Percentile --      Pulse Rate 07/05/24 1533 70     Resp 07/05/24 1533 16     Temp 07/05/24 1533 98.4 F (36.9 C)     Temp Source 07/05/24 1533 Oral     SpO2 07/05/24 1533 94 %     Weight 07/05/24 1532 224 lb (101.6 kg)     Height 07/05/24 1532 5' 8 (1.727 m)     Head Circumference --      Peak Flow --      Pain Score 07/05/24 1532 8     Pain Loc --      Pain Education --      Exclude from Growth Chart --    No data found.  Updated Vital Signs BP (!) 149/97 (BP Location: Right Arm)   Pulse 70   Temp 98.4 F (36.9 C) (Oral)   Resp 16   Ht 5' 8 (1.727 m)   Wt 224 lb (101.6 kg)   SpO2 94%   BMI 34.06 kg/m   Visual Acuity Right Eye Distance: 20/70 (Without correction, Denies history of correction) Left Eye Distance: 20/25 (Without  correction, Denies history of correction) Bilateral Distance: 20/25 (Without correction, Denies history of correction)  Right Eye Near:   Left Eye Near:    Bilateral Near:     Physical Exam Vitals and nursing note reviewed.  Constitutional:      Appearance: Normal appearance.  HENT:     Head: Normocephalic and atraumatic.     Right Ear: External ear normal.     Left Ear: External ear normal.     Nose: Nose normal.     Mouth/Throat:     Mouth: Mucous membranes are moist.  Eyes:     General: Vision grossly intact. Gaze aligned appropriately.     Conjunctiva/sclera:     Right eye: Right conjunctiva is injected.     Pupils:     Right eye: Corneal abrasion present.      Comments: Abrasion over right cornea  Swelling of upper and lower lids with bruising   Cardiovascular:     Rate and Rhythm: Normal rate.  Pulmonary:     Effort: Pulmonary effort is normal. No respiratory distress.  Skin:    General: Skin is warm and dry.  Neurological:     General: No focal deficit present.     Mental Status: He is alert and oriented to person, place, and time.  Psychiatric:        Mood and Affect: Mood normal.        Behavior: Behavior normal. Behavior is cooperative.      UC Treatments / Results  Labs (all labs ordered are listed, but only abnormal results are displayed) Labs Reviewed - No data to display  EKG   Radiology No results found.  Procedures Procedures (including critical care time)  Medications Ordered in UC Medications  Tdap (ADACEL) injection 0.5 mL (has no administration in time range)    Initial Impression / Assessment and Plan / UC Course  I have reviewed the triage vital signs and the nursing notes.  Pertinent labs & imaging results that were available during my care of the patient were reviewed by me and considered in my medical decision making (see chart for details).  Vitals and triage reviewed, patient is hemodynamically stable.  Fluorescein stain  reveals corneal abrasion to the right eye.  Does have swelling of upper and lower eyelids with surrounding bruising.  Is  nontender to the orbital area.  Lower concern for orbital fracture.  PERRLA.  Tdap updated in clinic due to penetrating injury with metal.  Will cover with Polytrim to help prevent bacterial infection.  Pain management discussed.  Vision grossly intact.  Is diminished on the right side, did have trouble keeping his right eye open during visual acuity exam.  Strict ophthalmology follow-up encouraged if vision changes.  Plan of care, follow-up care return precautions given, no questions at this time.  Work note provided.     Final Clinical Impressions(s) / UC Diagnoses   Final diagnoses:  Abrasion of right cornea, initial encounter  Eye injury, initial encounter     Discharge Instructions      Use the antibiotic eye drops 4x daily for 5 days to help prevent any infection of the eye.  Continue to ice the area.  Ibuprofen  800 mg every 8 hours can help with pain and inflammation.  Bruising and swelling will most likely worsen over the next 24 hours.  Pain should improve over the next day or so as the abrasion heals.  Seek immediate care with an ophthalmologist if you develop any vision changes.     ED Prescriptions     Medication Sig Dispense Auth. Provider   trimethoprim-polymyxin b (POLYTRIM) ophthalmic solution Place 1 drop into the right eye every 6 (six) hours for 5 days. 10 mL Dreama, Codee Tutson  N, FNP      PDMP not reviewed this encounter.   Dreama Aaleeyah Bias  N, FNP 07/05/24 1614
